# Patient Record
Sex: Female | Born: 1940 | Race: White | Hispanic: No | Smoking: Never smoker
Health system: Southern US, Community
[De-identification: ages and names within clinical notes are randomized; demographics above are authoritative.]

## PROBLEM LIST (undated history)

## (undated) DIAGNOSIS — G5760 Lesion of plantar nerve, unspecified lower limb: Secondary | ICD-10-CM

## (undated) DIAGNOSIS — E079 Disorder of thyroid, unspecified: Secondary | ICD-10-CM

## (undated) DIAGNOSIS — F32A Depression, unspecified: Secondary | ICD-10-CM

## (undated) DIAGNOSIS — I10 Essential (primary) hypertension: Secondary | ICD-10-CM

## (undated) DIAGNOSIS — E785 Hyperlipidemia, unspecified: Secondary | ICD-10-CM

## (undated) DIAGNOSIS — I639 Cerebral infarction, unspecified: Secondary | ICD-10-CM

## (undated) DIAGNOSIS — F419 Anxiety disorder, unspecified: Secondary | ICD-10-CM

## (undated) DIAGNOSIS — F329 Major depressive disorder, single episode, unspecified: Secondary | ICD-10-CM

## (undated) DIAGNOSIS — E559 Vitamin D deficiency, unspecified: Secondary | ICD-10-CM

## (undated) HISTORY — DX: Depression, unspecified: F32.A

## (undated) HISTORY — DX: Anxiety disorder, unspecified: F41.9

## (undated) HISTORY — PX: ABDOMINAL HYSTERECTOMY: SHX81

## (undated) HISTORY — DX: Lesion of plantar nerve, unspecified lower limb: G57.60

## (undated) HISTORY — DX: Major depressive disorder, single episode, unspecified: F32.9

## (undated) HISTORY — PX: BREAST BIOPSY: SHX20

## (undated) HISTORY — DX: Hyperlipidemia, unspecified: E78.5

## (undated) HISTORY — PX: ECTOPIC PREGNANCY SURGERY: SHX613

## (undated) HISTORY — DX: Disorder of thyroid, unspecified: E07.9

## (undated) HISTORY — PX: JOINT REPLACEMENT: SHX530

## (undated) HISTORY — DX: Essential (primary) hypertension: I10

## (undated) HISTORY — DX: Cerebral infarction, unspecified: I63.9

## (undated) HISTORY — DX: Vitamin D deficiency, unspecified: E55.9

---

## 1998-05-29 ENCOUNTER — Encounter: Payer: Self-pay | Admitting: Family Medicine

## 1998-05-29 ENCOUNTER — Ambulatory Visit (HOSPITAL_COMMUNITY): Admission: RE | Admit: 1998-05-29 | Discharge: 1998-05-29 | Payer: Self-pay | Admitting: Family Medicine

## 2000-03-26 ENCOUNTER — Ambulatory Visit (HOSPITAL_COMMUNITY): Admission: RE | Admit: 2000-03-26 | Discharge: 2000-03-26 | Payer: Self-pay | Admitting: Family Medicine

## 2000-03-26 ENCOUNTER — Encounter: Payer: Self-pay | Admitting: Family Medicine

## 2001-03-29 ENCOUNTER — Encounter: Payer: Self-pay | Admitting: Family Medicine

## 2001-03-29 ENCOUNTER — Ambulatory Visit (HOSPITAL_COMMUNITY): Admission: RE | Admit: 2001-03-29 | Discharge: 2001-03-29 | Payer: Self-pay | Admitting: Family Medicine

## 2001-07-29 ENCOUNTER — Encounter: Admission: RE | Admit: 2001-07-29 | Discharge: 2001-07-29 | Payer: Self-pay | Admitting: Family Medicine

## 2001-07-29 ENCOUNTER — Encounter: Payer: Self-pay | Admitting: Family Medicine

## 2001-08-12 ENCOUNTER — Encounter: Admission: RE | Admit: 2001-08-12 | Discharge: 2001-08-12 | Payer: Self-pay | Admitting: Family Medicine

## 2001-08-12 ENCOUNTER — Encounter: Payer: Self-pay | Admitting: Family Medicine

## 2002-06-15 ENCOUNTER — Encounter: Payer: Self-pay | Admitting: Family Medicine

## 2002-06-15 ENCOUNTER — Ambulatory Visit (HOSPITAL_COMMUNITY): Admission: RE | Admit: 2002-06-15 | Discharge: 2002-06-15 | Payer: Self-pay | Admitting: Family Medicine

## 2003-06-20 ENCOUNTER — Ambulatory Visit (HOSPITAL_COMMUNITY): Admission: RE | Admit: 2003-06-20 | Discharge: 2003-06-20 | Payer: Self-pay | Admitting: Family Medicine

## 2003-07-19 ENCOUNTER — Ambulatory Visit (HOSPITAL_COMMUNITY): Admission: RE | Admit: 2003-07-19 | Discharge: 2003-07-19 | Payer: Self-pay | Admitting: Gastroenterology

## 2003-07-19 ENCOUNTER — Encounter (INDEPENDENT_AMBULATORY_CARE_PROVIDER_SITE_OTHER): Payer: Self-pay | Admitting: *Deleted

## 2004-04-15 ENCOUNTER — Other Ambulatory Visit: Admission: RE | Admit: 2004-04-15 | Discharge: 2004-04-15 | Payer: Self-pay | Admitting: Family Medicine

## 2004-06-20 ENCOUNTER — Ambulatory Visit (HOSPITAL_COMMUNITY): Admission: RE | Admit: 2004-06-20 | Discharge: 2004-06-20 | Payer: Self-pay | Admitting: Family Medicine

## 2004-10-15 ENCOUNTER — Encounter: Admission: RE | Admit: 2004-10-15 | Discharge: 2004-10-15 | Payer: Self-pay | Admitting: Family Medicine

## 2005-07-25 ENCOUNTER — Other Ambulatory Visit: Admission: RE | Admit: 2005-07-25 | Discharge: 2005-07-25 | Payer: Self-pay | Admitting: Family Medicine

## 2005-08-13 ENCOUNTER — Ambulatory Visit (HOSPITAL_COMMUNITY): Admission: RE | Admit: 2005-08-13 | Discharge: 2005-08-13 | Payer: Self-pay | Admitting: Family Medicine

## 2005-12-15 ENCOUNTER — Encounter: Payer: Self-pay | Admitting: Vascular Surgery

## 2005-12-15 ENCOUNTER — Ambulatory Visit (HOSPITAL_COMMUNITY): Admission: RE | Admit: 2005-12-15 | Discharge: 2005-12-15 | Payer: Self-pay | Admitting: Family Medicine

## 2006-07-28 ENCOUNTER — Other Ambulatory Visit: Admission: RE | Admit: 2006-07-28 | Discharge: 2006-07-28 | Payer: Self-pay | Admitting: Family Medicine

## 2006-08-31 ENCOUNTER — Ambulatory Visit (HOSPITAL_COMMUNITY): Admission: RE | Admit: 2006-08-31 | Discharge: 2006-08-31 | Payer: Self-pay | Admitting: Family Medicine

## 2007-01-26 ENCOUNTER — Ambulatory Visit: Payer: Self-pay | Admitting: Internal Medicine

## 2007-02-08 ENCOUNTER — Ambulatory Visit: Payer: Self-pay | Admitting: Cardiovascular Disease

## 2007-02-09 ENCOUNTER — Ambulatory Visit: Payer: Self-pay | Admitting: Internal Medicine

## 2007-02-09 DIAGNOSIS — I10 Essential (primary) hypertension: Secondary | ICD-10-CM

## 2007-02-09 DIAGNOSIS — G4730 Sleep apnea, unspecified: Secondary | ICD-10-CM | POA: Insufficient documentation

## 2007-02-09 DIAGNOSIS — E669 Obesity, unspecified: Secondary | ICD-10-CM

## 2007-02-09 DIAGNOSIS — J383 Other diseases of vocal cords: Secondary | ICD-10-CM

## 2007-04-04 DIAGNOSIS — I639 Cerebral infarction, unspecified: Secondary | ICD-10-CM

## 2007-04-04 HISTORY — DX: Cerebral infarction, unspecified: I63.9

## 2007-04-10 ENCOUNTER — Ambulatory Visit: Payer: Self-pay | Admitting: Internal Medicine

## 2007-04-10 ENCOUNTER — Inpatient Hospital Stay (HOSPITAL_COMMUNITY): Admission: EM | Admit: 2007-04-10 | Discharge: 2007-04-13 | Payer: Self-pay | Admitting: Emergency Medicine

## 2007-04-10 ENCOUNTER — Encounter (INDEPENDENT_AMBULATORY_CARE_PROVIDER_SITE_OTHER): Payer: Self-pay | Admitting: Neurology

## 2007-04-11 ENCOUNTER — Encounter (INDEPENDENT_AMBULATORY_CARE_PROVIDER_SITE_OTHER): Payer: Self-pay | Admitting: Neurology

## 2007-04-12 ENCOUNTER — Encounter (INDEPENDENT_AMBULATORY_CARE_PROVIDER_SITE_OTHER): Payer: Self-pay | Admitting: Neurology

## 2007-04-13 ENCOUNTER — Encounter: Payer: Self-pay | Admitting: Internal Medicine

## 2007-04-24 ENCOUNTER — Ambulatory Visit: Payer: Self-pay | Admitting: Internal Medicine

## 2007-04-24 ENCOUNTER — Other Ambulatory Visit: Payer: Self-pay

## 2007-04-24 ENCOUNTER — Observation Stay: Payer: Self-pay | Admitting: Internal Medicine

## 2007-04-27 ENCOUNTER — Ambulatory Visit: Payer: Self-pay | Admitting: Cardiology

## 2007-09-01 ENCOUNTER — Ambulatory Visit (HOSPITAL_COMMUNITY): Admission: RE | Admit: 2007-09-01 | Discharge: 2007-09-01 | Payer: Self-pay | Admitting: Family Medicine

## 2007-09-06 ENCOUNTER — Encounter: Admission: RE | Admit: 2007-09-06 | Discharge: 2007-09-06 | Payer: Self-pay | Admitting: Family Medicine

## 2008-01-18 ENCOUNTER — Inpatient Hospital Stay (HOSPITAL_COMMUNITY): Admission: RE | Admit: 2008-01-18 | Discharge: 2008-01-21 | Payer: Self-pay | Admitting: Orthopedic Surgery

## 2008-06-01 ENCOUNTER — Ambulatory Visit: Payer: Self-pay | Admitting: Internal Medicine

## 2008-06-01 DIAGNOSIS — R05 Cough: Secondary | ICD-10-CM

## 2008-06-05 ENCOUNTER — Telehealth (INDEPENDENT_AMBULATORY_CARE_PROVIDER_SITE_OTHER): Payer: Self-pay | Admitting: *Deleted

## 2008-09-07 ENCOUNTER — Ambulatory Visit (HOSPITAL_COMMUNITY): Admission: RE | Admit: 2008-09-07 | Discharge: 2008-09-07 | Payer: Self-pay | Admitting: Internal Medicine

## 2009-09-18 ENCOUNTER — Encounter: Admission: RE | Admit: 2009-09-18 | Discharge: 2009-09-18 | Payer: Self-pay | Admitting: Family Medicine

## 2009-10-11 ENCOUNTER — Ambulatory Visit (HOSPITAL_COMMUNITY): Admission: RE | Admit: 2009-10-11 | Discharge: 2009-10-11 | Payer: Self-pay | Admitting: Family Medicine

## 2009-11-27 ENCOUNTER — Encounter: Admission: RE | Admit: 2009-11-27 | Discharge: 2009-11-27 | Payer: Self-pay | Admitting: Neurology

## 2010-07-16 NOTE — Assessment & Plan Note (Signed)
[facility]                            CARDIOLOGY OFFICE NOTE   RAIGAN, BARIA                 MRN:          [id number]  DATE:02/08/2007                            DOB:          Sep 07, 1940    Ms. Borrelli is a pleasant patient referred by Dr. Kriste Basque and Rubye Oaks for chest pain, dyspnea and hypertension.   The patient is extremely talkative.  She is a former Social worker.  She  actually needs preoperative clearance as well for knee surgery in  February by Dr. Charlann Boxer.  She has been having increasing shortness of  breath.  She has a fairly prolonged history of vocal cord problems and  has been treated for reflux.   She is not a previous smoker.  She was seen for pneumonia a couple of  months ago and was treated with azithromycin for two courses.  She has  PFTs in the chart which really do not show any significant emphysema.  She had a CT scan at [facility] on January 22, 2007 that  was negative for PE without any significant pneumonia or interstitial  lung disease.  In talking to the patient, she has significant arthritis  in both knees. She has gained 25 pounds in the last year and I suspect  this has a lot to do with her dyspnea.   She has had some fleeting atypical chest pain that is not always  exertional, it can be at rest. It is not progressive. There is no  radiation or associated diaphoresis and the pain does not clearly  positional, it happens maybe once a month.   She has no documented history of coronary disease.  She did go to a  health fair recently where ABIs were normal, there was no evidence of  carotid disease.  She was told she had an abnormal EKG and needed a  followup.  Her EKG is essentially normal except for a tall R wave in  lead V1 with insignificant Q waves in II, III and F.   Review of systems is otherwise negative.   PAST MEDICAL HISTORY:  1. Remarkable for osteoarthritis of the knees.  2. She  had a treadmill test which was normal in '98 in [place].  3. She has a history of reflux.  4. Hypertension.  5. Hypothyroidism.   ALLERGIES:  No known drug allergies.   FAMILY HISTORY:  Noncontributory.   MEDICATIONS:  She is currently on Levothroid 1200 mcg daily, Celebrex 1-  2 p.r.n., Micardis 40/12.5, Nexium 40 daily.   PHYSICAL EXAMINATION:  GENERAL:  Remarkable for talkative and overweight  white female in no distress.  VITAL SIGNS:  Weight is 217, respiratory rate 14, afebrile, blood  pressure is 148/87, pulse 88 and regular, respiratory rate 14, afebrile.  HEENT:  Unremarkable.  NECK:  Carotids are normal without bruit. No lymphadenopathy,  thyromegaly, JVP elevation.  LUNGS:  Clear. Good diaphragmatic motion, no wheezing.  CARDIOVASCULAR:  S1, S2 normal heart sounds, PMI normal.  ABDOMEN:  Benign, bowel sounds positive, no tenderness, no bruit, no  hepatosplenomegaly, no hepatojugular reflux.  EXTREMITIES:  Distal  pulses intact, no edema.  NEURO:  Nonfocal.  SKIN:  Warm and dry.  MUSCULOSKELETAL:  No muscular weakness.   EKG was as indicated with a tall R wave in V1 and significant Q waves in  II, III and F.  I reviewed her chest x-ray and CT scan.   IMPRESSION:  1. Chest pain, fleeting atypical but need for postoperative clearance.      Check Adenosine Myoview.  2. Exertional dyspnea not clearly related to her lungs; normal PFT, CT      with no PE.  Check 2D echocardiogram, doubt significant decrease in      LV function or valvular heart disease.  3. Hypertension.  Suggest she should go up on her Micardis to 80/12.5,      will leave this up to [employer] who she will see tomorrow. I      wrote a script for it. Continue low salt diet.  4. History of reflux with vocal cord problems. Continue Nexium 40      daily. Follow with Dr. Sherene Sires.  5. Osteoarthritis of the knees. Echocardiogram and Cardiolite will      likely be normal and she can be cleared for surgery  with Dr. Charlann Boxer.      I suspect she will proceed with left knee surgery first as it is      worst.     Theron Arista C. Eden Emms, MD, [facility]  Electronically Signed    PCN/MedQ  DD: 02/08/2007  DT: 02/08/2007  Job #: [id number]   cc:   Lonzo Cloud. Kriste Basque, MD  Madlyn Frankel Charlann Boxer, M.D.  Rubye Oaks, NP

## 2010-07-16 NOTE — Discharge Summary (Signed)
NAMEAARUSHI, HEMRIC          ACCOUNT NO.:  [id number]   MEDICAL RECORD NO.:  [id number]          PATIENT TYPE:  INP   LOCATION:  1613                         FACILITY:  [facility]   PHYSICIAN:  Madlyn Frankel. Charlann Boxer, M.D.  DATE OF BIRTH:  Nov 30, 1940   DATE OF ADMISSION:  01/18/2008  DATE OF DISCHARGE:  01/21/2008                               DISCHARGE SUMMARY   ADMITTING DIAGNOSES:  1. Osteoarthritis.  2. Hypertension.  3. Patent foramen ovale.  4. Right hemispheric ischemic stroke.  5. Hypothyroidism.   DISCHARGE DIAGNOSES:  1. Osteoarthritis.  2. Hypertension.  3. Patent foramen ovale.  4. Right hemispheric ischemic stroke.  5. Hypothyroidism.   HISTORY OF PRESENT ILLNESS:  A 70 year old female with a history of left  knee pain secondary to osteoarthritis.  Refractory to all conservative  treatment.   CONSULTS:  None.   PROCEDURE:  Left total knee replacement by surgeon Dr. Durene Romans.  Assistant Dwyane Luo, P.A.-C.   LABORATORY DATA:  Preoperative UA showed rare bacteria.  CBC, tracked  throughout her course of stay, final reading of her CBC showed her white  blood cells of 9, hemoglobin 9.8, hematocrit 28.5, platelets 167.  Metabolic, final reading, sodium 141, potassium 4.1, BUN 11, creatinine  was f0.74, glucose 119.  Calcium 8.5.  Coagulation all within normal  limits.   RADIOLOGY:  Portable chest showed no acute cardiopulmonary disease.   CARDIOLOGY:  No EKG found in chart.   HOSPITAL COURSE:  Patient underwent a left total knee replacement,  tolerated procedure well, was admitted to the orthopedic floor.  She had  limited progress during the course of stay.  She was hemodynamically  stable throughout.  Her dressing was changed.  There was no significant  drainage from the wound.  We did keep her fluids increased for the first  2 days just to continue with good kidney perfusion.  She remained  afebrile.  We did give her some Xanax for some anxiety issues while  in  the hospital.  When seen on day 3, she was ready for discharge to SNF as  a bed was available.  Wound looked good.  She was able to do a straight  leg raise and was in good spirits and motivated.   DISCHARGE DISPOSITION:  Discharge to skilled nursing facility rehab,  stable and improved condition.   DISCHARGE PHYSICAL THERAPY:  Weightbearing as tolerated with use of  rolling walker with progression to single-point cane over the next  couple weeks.  Goals of physical therapy will be to attain a range of  motion 0 to 120 degrees, increase hip flexure, quad strength.  Encourage  independence of activities of daily living.   DISCHARGE WOUND CARE:  Keep dry.   DISCHARGE MEDICATIONS:  1. Robaxin 500 mg 1 p.o. q.6 p.r.n. muscle spasm pain.  2. Iron 325 mg 1 p.o. t.i.d. x2 weeks.  3. Colace 100 mg 1 p.o. b.i.d. p.r.n. constipation while on narcotics.  4. MiraLax 17 g p.o. daily p.r.n. constipation while on narcotics.  5. Norco 7.5/325 one to two p.o. q.4 to 6 p.r.n. pain.  6. Micardis HCT 80/12.5  one p.o. q.a.m.  7. Levothyroxine 100 mcg p.o. q.a.m.  8. Omeprazole 20 mg 1 p.o. q.a.m.  9. Metoprolol 25 mg 1/2 tab b.i.d.  10.Pravastatin 20 mg 1 p.o. daily  11.Zolpidem 10 mg 1 p.o. q.h.s.  12.Plavix 75 mg p.o. daily.  13.Vitamin C 1000 mg 1 p.o. daily.  14.Vitamin D 400 units 1 p.o. daily.  15.Multivitamin p.o. daily.   DISCHARGE FOLLOWUP:  1. Follow up with Dr. Charlann Boxer at phone number [phone] in 2 weeks for      wound check.  2. Follow up with [facility] about spot on abdomen, phone      number [phone] at patient's convenience.     ______________________________  Yetta Glassman. Loreta Ave, [place]      Madlyn Frankel. Charlann Boxer, M.D.  Electronically Signed    BLM/MEDQ  D:  01/21/2008  T:  01/21/2008  Job:  [id number]   cc:   Gretta Arab. Valentina Lucks, M.D.  Fax: [phone]   Coral Ceo, M.D.   Estanislado Pandy, MD  Fax: [phone]   Pramod P. Pearlean Brownie, MD  Fax: [phone]

## 2010-07-16 NOTE — Discharge Summary (Signed)
NAME:  Emily Bartlett, Emily Bartlett          ACCOUNT NO.:  [id number]   MEDICAL RECORD NO.:  [id number]          PATIENT TYPE:  INP   LOCATION:  4731                         FACILITY:  [facility]   PHYSICIAN:  Pramod P. Pearlean Brownie, MD    DATE OF BIRTH:  [birth year]   DATE OF ADMISSION:  04/10/2007  DATE OF DISCHARGE:  04/13/2007                               DISCHARGE SUMMARY   DIAGNOSES AT TIME OF DISCHARGE:  1. 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  2. Hypertension.  3. Obesity.  4. Gastroesophageal reflux disease.  5. Hypothyroidism.  6. Hysterectomy in 1992 with bilateral salpingo-oophorectomy.  7. Gingivitis.  8. Ectopic pregnancy, 1972.   MEDICINES AT TIME OF DISCHARGE:  1. Micardis/hydrochlorothiazide 80/12.5 mg one a day.  2. Omeprazole 20 mg a day.  3. Synthroid 100 mcg a day.  4. Plavix 75 mg a day.   STUDIES PERFORMED:  1. CT of the brain on admission showed no acute abnormalities.  2. MRI of the brain showed a right MCA M2 branch infarct.  3. MRA of the neck was unremarkable.  4. MRA shows mild supraclinoid atherosclerotic changes in the left      internal carotid artery that is nonstenotic.  5. 2-D echocardiogram shows EF of 80%, no source of on bolus.  6. Carotid Doppler shows no ICA stenosis.  7. TEE performed by Dr. 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  8. EKG shows normal sinus rhythm with occasional PACs, lateral infarct      age undetermined, inferior infarct age undetermined.   LABORATORY STUDIES:  CBC is normal.  Chemistry with glucose 125,  otherwise normal.  Coagulation studies normal.  Liver function tests  normal.  Cardiac enzymes negative.  Cholesterol 161, triglycerides 143,  HDL 54 and LDL 78.  Hemoglobin A1c is 6.6.  Hypercoagulable panel showed  antithrombin-3 of 90.  Protein C functional 180.  Protein C  102.  Protein S functional 125.  Protein S 152.  Lupus anticoagulant is  negative.  Factor V gene mutation is negative.  Prothrombin-2 gene  mutation is negative.   HISTORY OF PRESENT ILLNESS:  Emily Bartlett is a 70 year old  Caucasian female who was reading and watched TV the day of admission  when around 2 o'clock in the afternoon she found herself unable to speak  distinctly and had left facial droop and left arm weakness.  She arrived  to the emergency room.  By the time she arrived here, symptoms had  largely cleared.  She has risk factors for stroke of hypertension and  obesity.  She was not a t-PA candidate secondary to quick resolution.  She was admitted to the hospital for further stroke evaluation.   HOSPITAL COURSE:  MRI did show acute stroke in the right MCA M2 branch.  The branch looked occluded as per embolus, but no embolic source was  found including after doing a transesophageal echocardiogram.  Of note,  the TEE did show a small PFO with positive right-to-left shunt with  Valsalva maneuver and a positive bubble study.  She was placed on Plavix  for secondary stroke prevention.  In the hospital she only had some mild  dysarthria and this is continuing to improve.  She has no further  therapy needs.   CONDITION ON DISCHARGE:  The patient alert and or x3.  Speech slightly  dysarthric though no aphasia.  Her visual fields are full.  Her  extraocular movements are intact.  Heart rate is regular.  Breath sounds  were clear.  Abdomen is soft.  She has good strength in all four  extremities with no drift.  Her sensation is within normal limits to  light touch.   DISCHARGE/PLAN:  1. Discharge home with family.  2. No therapy needs.  3. Plavix for secondary stroke prevention.  4. Outpatient bubble study and emboli monitoring at Dr. Marlis Edelson office      in the next 1 month, the patient to call and set up appointment.  5. Follow up with Dr. Maurice Small within 1  month.      Emily Bartlett, N.P.    ______________________________  Emily Schlein. Pearlean Brownie, MD    SB/MEDQ  D:  04/13/2007  T:  04/15/2007  Job:  [id number]   cc:   Gretta Arab. Valentina Lucks, M.D.

## 2010-07-16 NOTE — H&P (Signed)
NAMEVIRGENE, TIRONE          ACCOUNT NO.:  [id number]   MEDICAL RECORD NO.:  [id number]          PATIENT TYPE:  INP   LOCATION:  1613                         FACILITY:  [facility]   PHYSICIAN:  Madlyn Frankel. Charlann Boxer, M.D.  DATE OF BIRTH:  1940-07-22   DATE OF ADMISSION:  01/18/2008  DATE OF DISCHARGE:                              HISTORY & PHYSICAL   PROCEDURE:  Left total knee replacement.   CHIEF COMPLAINT:  Left knee pain.   HISTORY OF PRESENT ILLNESS:  A 70 year old female with a history of left  knee pain secondary to osteoarthritis.  It has been refractory to all  conservative treatment.  She does have a recent history of right  hemispheric ischemic stroke and has been on Plavix.  She was  presurgically assessed prior to surgery with discontinuation of the  Plavix 7 days before surgery with the use of aspirin perioperatively.   PAST MEDICAL HISTORY:  Significant for:  1. Osteoarthritis.  2. Hypertension.  3. Patent foramen ovale.  4. Right hemispheric ischemic stroke.  5. Hypothyroidism.   PAST SURGERIES:  None.   FAMILY HISTORY:  Cancer, glaucoma, breast cancer, melanoma.   SOCIAL HISTORY:  She is widowed.  She is a previous Runner, broadcasting/film/video and a Water quality scientist.  She does not have a caregiver lined up after  surgery, is planning on a rehab stay postoperatively.   DRUG ALLERGIES:  No known drug allergies.   PRIMARY CARE PHYSICIAN:  Dr. Maurice Small.   SPECIALTY PHYSICIANS:  Include:  1. Dr. Coral Ceo, cardiologist at [facility].  2. Dr. Deneen Harts.  3. Dr. Delia Heady.   CURRENT MEDICATIONS:  1. Levothyroxine 100 mcg p.o. daily.  2. Omeprazole unknown dosage amount daily.  3. Plavix daily.  4. Aspirin 81 mg p.o. daily x7 days before surgery.  5. Pravastatin daily.  6. Metoprolol daily.  7. Vitamin D 400 units daily.  8. Micardis 12.5 mg p.o. daily.  9. Ambien 10 mg p.o. nightly p.r.n.  10.Celebrex 100 mg 1 p.o. b.i.d. x2 weeks after  surgery.   Please verify all dosage and frequencies with the patient prior to  administration and upon admission to hospital.   REVIEW OF SYSTEMS:  GENERAL:  She has some fatigue issues.  HEENT:  She  has some hearing loss.  GENITOURINARY:  She has increased urinary  frequency and urinating at night.  MUSCULOSKELETAL:  She has joint pain,  morning stiffness.  Otherwise see HPI.   PHYSICAL EXAMINATION:  GENERAL:  Awake, alert and oriented, well-  developed, well-nourished, no acute distress.  Height 5 feet 4 inches.  Weight 200 pounds.  Pulse 72.  Respirations 16.  Blood pressure 104/76.   HEENT:  Normocephalic.  NECK:  Supple.  No carotid bruits.  CHEST:  Lungs are clear to auscultation bilaterally.  BREASTS:  Deferred.  HEART:  Regular rate and rhythm.  S1, S2 distinct.  ABDOMEN:  Soft, nontender, nondistended.  Bowel sounds present.  PELVIC:  Stable.  GENITOURINARY:  Deferred.  EXTREMITIES:  Left knee 10 to 100 degrees varus deformity.  SKIN:  No cellulitis.  NEUROLOGIC:  Intact  distal sensibilities.   LABS:  EKG, chest x-ray all pending presurgical testing.   IMPRESSION:  Left knee osteoarthritis.   PLAN OF ACTION:  A left total knee replacement [facility] by  surgeon Dr. Durene Romans on January 18, 2008.  Risks and complications  were discussed.   No postoperative medications were provided at time of history and  physical as the patient is planning on a rehab stay postoperatively.  Celebrex was provided for perioperative use and will be continued 2  weeks after surgery.     ______________________________  Yetta Glassman [address], [place]      Madlyn Frankel. Charlann Boxer, M.D.  Electronically Signed    BLM/MEDQ  D:  01/19/2008  T:  01/19/2008  Job:  [id number]   cc:   Gretta Arab. Valentina Lucks, M.D.  Fax: [phone]   Coral Ceo, DR   Estanislado Pandy, MD  Fax: [phone]   Pramod P. Pearlean Brownie, MD  Fax: [phone]

## 2010-07-16 NOTE — Assessment & Plan Note (Signed)
HEALTHCARE                             PULMONARY OFFICE NOTE   Emily Bartlett, Emily Bartlett                 MRN:          [id number]  DATE:01/26/2007                            DOB:          [birth year]    REFERRING PHYSICIAN:  Pearline Cables, MD   CHIEF COMPLAINT:  Cough and shortness of breath.   HISTORY:  This is a 70 year old white female, a never smoker, who was  evaluated for a similar problem in 2004, by Dr. Barbaraann Share, who  felt that she had vocal cord dysfunction secondary to LPR.  He had  placed her on Protonix twice daily for one month only, and she seemed  better after this time and therefore did not continue the medicine or  see [place] back,   She comes in now with paroxysms of dyspnea over the last year, typically  associated with loss of voice, worse with colds, but otherwise no  particular pattern in terms of an obvious aggravating or alleviating  factor.  It seems to be worse as the day goes on.  It is not associated  with excess mucus production or chest pain.  She actually does fine  sleeping.   PAST MEDICAL HISTORY:  Significant for obesity complicated by  hypertension and sleep apnea.   PAST SURGICAL HISTORY:  She is status post hysterectomy and a tubal  ligation.   ALLERGIES:  No known drug allergies.   CURRENT MEDICATIONS:  1. Levothroid.  2. Celebrex.  3. Although she only listed the above, she also takes Enalapril.  4. She has been placed on Hycodan cough syrup.  5. Zithromax.  6. Omnicef.  All of these with no apparent improvement.   SOCIAL HISTORY:  She has never smoked.  She denies any unusual travel,  pet or hobby exposure.   FAMILY HISTORY:  Positive for lung cancer in her father and mother.   REVIEW OF SYSTEMS:  Taken in detail on the work sheet and negative other  than as above.  The patient has had a pattern for years and years of  intermittent migratory chest discomfort sometimes in the left flank  and  sometimes in the right, only lasting for a few seconds.  Never pleuritic  or related to exertion.  Never occurring lying down (typical of  irritable bowel syndrome), and not associated with dyspnea or nausea.   PHYSICAL EXAMINATION:  GENERAL:  She is an animated white female who has  a hard time sticking to one question at a time before jumping to  another.  She was an exceptionally difficult historian who prefers to  dwell on diagnoses rather than answer questions related to symptoms.  VITAL SIGNS:  She is afebrile.  Blood pressure 150/98 today.  HEENT:  Unremarkable.  There is no nasal turbinate edema and no erythema  of the oropharynx.  Ear canals clear bilaterally.  NECK:  Supple without cervical adenopathy or tenderness.  Trachea is  midline.  No thyromegaly.  LUNGS:  Fields are perfectly bilaterally to percussion, although there  was minimal pseudo-wheeze.  HEART:  A regular rhythm without murmur, gallop or  rub.  ABDOMEN:  Soft, benign.  EXTREMITIES:  Warm without calf tenderness, cyanosis, clubbing or edema.   Although she has carried a diagnosis of pneumonia, I do not have an x-  ray.  The most recent studies that she is a CT scan dated January 22, 2007, and is normal.   IMPRESSION/PLAN:  1. 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  Interestingly, she had a very convincing response to      proton pump inhibitor therapy four years ago and did not put two      and two together, probably because it takes more than just the      reflux to bring out the symptoms.  For instance, she told me a      cold would make her worse.  Now it may well be that the ACE      inhibitor is also making her worse, albeit through different      mechanisms.  In any case, she does not appear to have a significant      lung problem and I believe the problem can be resolved by  treating      her aggressively for reflux, as we did in the past as follows:  1.      Continue to take Nexium 40 mg, one tab 30-60 minutes before      breakfast every day.  2.  Change Enalapril to Micardis 40/12.5 mg      one daily. 3.  Return in two weeks, to check response and obtain      prescriptions for the above medications, if improved, and if she is      improved I would treat her for three whole months before      considering changing the therapy.  If not improved, the next step      would be a sinus CT scan and increase in the medications for      reflux, but could include twice daily proton pump inhibitor,      ranitidine q.h.s. and/or Reglan.  2. She does have a history of fleeting anterior chest pains that are      migratory in nature.  It is strongly suggested to me an irritable      bowel syndrome.  I have recommended Citrucel 1 tsp twice daily.  3. I noted that her blood pressure is not well-controlled on      Enalapril, and may need to adjust the Micardis upward on her next      visit.     Emily Bartlett. Emily Sires, MD, [facility]  Electronically Signed    MBW/MedQ  DD: 01/27/2007  DT: 01/27/2007  Job #: [id number]

## 2010-07-16 NOTE — Op Note (Signed)
Emily Bartlett, Emily Bartlett          ACCOUNT NO.:  [id number]   MEDICAL RECORD NO.:  [id number]          PATIENT TYPE:  INP   LOCATION:  1613                         FACILITY:  [facility]   PHYSICIAN:  Madlyn Frankel. Charlann Boxer, M.D.  DATE OF BIRTH:  1940/03/25   DATE OF PROCEDURE:  01/18/2008  DATE OF DISCHARGE:                               OPERATIVE REPORT   PREOPERATIVE DIAGNOSIS:  Left knee osteoarthritis.   POSTOPERATIVE DIAGNOSIS:  Left knee osteoarthritis.   PROCEDURE:  Left total knee replacement.   COMPONENTS USED:  DePuy rotating platform posterior stabilized knee  system with a size 3 femur, 3 tibia, 12.5 insert and a 38 patellar  button.   SURGEON:  Madlyn Frankel. Charlann Boxer, M.D.   ASSISTANT:  Yetta Glassman. Mann, PA-C   ANESTHESIA:  Duramorph spinal.   COMPLICATIONS:  None.   DRAINS:  Times one Hemovac.   TOURNIQUET TIME:  35 minutes at 250 mmHg.   SPECIMENS:  None.   CONDITION:  The patient was taken to recovery room stable.   INDICATION FOR PROCEDURE:  Emily Bartlett is a 70 year old female who I  have been following for a couple of years for bilateral knee  osteoarthritis failing conservative measures.  She had attempted to push  things off as long as possible, failing at this point cortisone  injection, viscosupplemental medications.  Given the functionally  decreased quality of life she wished to proceed with knee replacement  surgery.  The risk of infection, DVT, component failure, need for  revision for any reason were all discussed and reviewed.  Consent was  obtained for the benefit of pain relief.   PROCEDURE IN DETAIL:  The patient was brought to the operative theater.  Once adequate anesthesia, preoperative antibiotics, Ancef administered,  the patient was positioned supine with a thigh tourniquet placed on the  left.  The left lower extremity was then pre scrubbed and prepped and  draped in sterile fashion.   A time-out was then carried out identifying the patient and  her  extremity.  The leg was exsanguinated, tourniquet elevated.  Midline  incision was made followed by median arthrotomy.  Following initial  debridement attention was directed to the patella.  Precut measure was  24 mm.  I resected down to 14 mm and used a 38 patellar button restoring  the height to that of 23-24 mm.  A metal shim was placed on the patella  to prevent the patella from injury to the saw retractors.   The patient was noted to have a preoperative flexion contracture.  Each  intramedullary canal was opened and the femur irrigated to prevent fat  emboli.  At 3 degrees of valgus I resected 11 mm of bone off the distal  femur.  The tibia was then subluxated, entered anteriorly and meniscal  stumps and cruciate stumps debrided.  Using extramedullary guide I  resected 10 mm of bone off the proximal lateral tibia.  Following this  resection I checked for extension block and was happy the knee was  coming to extension with a 10 mm insert.  The pins were removed from the  extramedullary guide.  At this point I sized the femur to be a size 3.  We checked the cut  surface of the tibia to make sure it was perpendicular in both planes.  Based on this cut the rotation was set for the femur.  The  intramedullary rod was placed and the rotation block placed.  Based on  the proximal tibia cut the rotation block was pinned then exchanged for  the size 3, 4 in 1 cutting block.  The anterior and posterior chamfer  cuts were then made without difficulty nor notching.  Final box cut was  made based off the lateral aspect of the distal femur.   At this point the tibial subluxating tear and further debridement was  carried out.  This included medial osteophytes and releasing the MCL.  I  then used a size 3 tibial tray, pinned it in position to the medial  third of the tubercle with excellent bony coverage on the cortical rim  of the tibia.  I then drilled and keel punched the tibia and did a  trial  reduction.  At this point the knee came to full extension with a 10  insert and the 38 patella tracked through the trochlea without  application of pressure.   At this point the trial components were removed.  Final components were  opened on the back table holding the polyethylene insert.  The knee was  irrigated with normal saline solution pulse lavaged.  The synovial  capsule junction was injected with 60 mL of 0.25% Marcaine with  epinephrine and 1 mL of Toradol.   Once the knee was dried the components were cemented into position.  I  brought the knee to extension with a 10 mm insert.  Extruded cement was  removed.  Once the cement had cured I did retrial with a 12.5 insert and  felt that the knee felt a bit more stable all the way from extension and  flexion.  With this I removed remaining cement and debris from the  posterior aspect of the knee, irrigated the knee and then placed a 12.5  insert to match the 3 femur.  Pulse lavage irrigation was carried out at  this point.  The medium Hemovac drain was placed in the deep capsular  tissue.   The extensor mechanism was then reapproximated using #1 Vicryl with the  knee in flexion.  The remainder of the wound was closed in layers with 2-  0 Vicryl and running 4-0 Monocryl.  The knee was cleaned, dried and  dressed sterilely with a sterile bulky wrap.  She was brought to the  recovery room in stable condition tolerating the procedure well.      Madlyn Frankel Charlann Boxer, M.D.  Electronically Signed     MDO/MEDQ  D:  01/18/2008  T:  01/19/2008  Job:  [id number]

## 2010-07-16 NOTE — Discharge Summary (Signed)
NAMESEARA, [name]          ACCOUNT NO.:  [id number]   MEDICAL RECORD NO.:  [id number]          PATIENT TYPE:  INP   LOCATION:  4731                         FACILITY:  [facility]   PHYSICIAN:  Estanislado Pandy, MD   DATE OF BIRTH:  [birth year]   DATE OF ADMISSION:  04/10/2007  DATE OF DISCHARGE:                               DISCHARGE SUMMARY   DIAGNOSIS AT TIME OF DISCHARGE:  1. 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  2. Hypertension.  3. Obesity.  4. New diagnosis of PFO.  5. Gravida 3, para 3.  6. Ectopic pregnancy 1972.  7. Hysterectomy with bilateral salpingo-oophorectomy.  8. Gingivitis.      1. Hypothyroidism.   MEDICINES AT TIME OF DISCHARGE:  Synthroid 0.1 mg a day, Nexium 40 mg a  day, Micardis/hydrochlorothiazide 80/12.5 mg a day and Plavix 75 mg a  day.   STUDIES PERFORMED:  1. A CT of the brain on admission shows no acute abnormality.  2. An MRI of the brain shows an acute right hemisphere infarct due to      occlusion of distal MCA M2 branch deep within the sylvian fissure.      Mild atrophy and small vessel disease.  No abnormal intracranial      enhancement.  3. An MRA of the neck is unremarkable.  4. An MRA of the head shows mild supraclinoid atherosclerotic changes      left internal carotid artery non stenotic.  5. She has carotid Doppler shows no ICA stenosis.  6. A 2-D echocardiogram shows EF of 80% with no source of embolus.  7. An EKG shows normal sinus rhythm with occasional PAC's, lateral      infarct, age undetermined, inferior infarct, age undetermined.   LABORATORY STUDIES:  Complete blood count normal. Chemistry with glucose  125, otherwise normal. Coagulation studies normal. Liver function tests  normal.  Cardiac enzymes negative.  Cholesterol 161, triglycerides 143,  HDL 54, LDL 78. Hemoglobin A1c was 6.6. Homocystine is pending at time  of discharge.  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  Protein C functional high 180. Protein C 102. Protein S functional 125.  Protein S high 152.  Lupus anticoagulant not detected.  Homocystine is  9.  Factor five mutation is negative. Negative prothrombin II gene  mutation.   HISTORY OF PRESENT ILLNESS:  [name] is a 70 year old  Caucasian female who was reading and watching TV the day of admission  around 2:00 p.m. She found herself unable to speak distinctly and had  left facial droop and left arm weakness.  She called EMS and by the time  she arrived to the hospital, her symptoms have largely cleared.  She has  vascular risk factors of hypertension and obesity.  She also has  gastroesophageal reflux disease and hypothyroidism.  She was not a TPA  candidate secondary to rapid improvement. She was admitted to the  hospital for further stroke evaluation.   ADDITIONAL DISCHARGE DIAGNOSIS:  Gastroesophageal reflux disease.   HOSPITAL COURSE:  Her MRI was positive for acute infarct in  the right  MCA M2 branch.  It looked occluded from an embolic source, though full  workup was completed including TEE.  No embolic source was found, though  the patient was found to have a small PFO with a positive bubble study.  She was placed on Plavix in hospital for secondary stroke prevention.  Her only neurologic residual is that of slurred speech which is  continuing to improve.  She has no outpatient therapy needs and was felt  safe for discharge home.   ADDITIONAL STUDY PERFORMED:  Transesophageal echocardiogram performed by  Dr. Gala Romney with [facility] cardiology shows a positive bubble study with  small PFO with right-to-left shunting with Valsalva maneuver, mild to  moderate plaque in the descending aorta.   DISCHARGE/PLAN:  1. Discharge home with family.  2. Plavix for secondary stroke prevention.   1. No therapy needs.  2. Outpatient bubble study and emboli monitoring in 1 month.  The      patient to call and set up  appointment.  3. Follow up with Dr. Maurice Small within 1 month.      Annie Main, N.P.    ______________________________  Estanislado Pandy, MD    SB/MEDQ  D:  04/13/2007  T:  04/14/2007  Job:  [id number]   cc:   Gretta Arab. Valentina Lucks, M.D.  Pramod P. Pearlean Brownie, MD

## 2010-07-16 NOTE — H&P (Signed)
NAME:  Emily Bartlett, Emily Bartlett          ACCOUNT NO.:  [id number]   MEDICAL RECORD NO.:  [id number]          PATIENT TYPE:  EMS   LOCATION:  MAJO                         FACILITY:  [facility]   PHYSICIAN:  Deanna Artis. Hickling, M.D.DATE OF BIRTH:  04/11/1940   DATE OF ADMISSION:  04/10/2007  DATE OF DISCHARGE:                              HISTORY & PHYSICAL   CHIEF COMPLAINT:  Left facial weakness and arm weakness.   HISTORY OF PRESENT ILLNESS:  1. The patient was reading and watching TV today.  Around 2 o'clock      she was unable to speak distinctly and had left facial droop and      left arm weakness.  This largely cleared by the time she arrived at      the hospital.  2. Risk factors for stroke include hypertension and obesity.  Other      medical problems include gastroesophageal reflux disease and      hypothyroidism.   PAST MEDICAL HISTORY:  Otherwise, unremarkable.  Gravida 3, para 3-0-1-3  woman.   PAST SURGICAL HISTORY:  Ectopic pregnancy 1972, hysterectomy 1992 with  bilateral salpingo-oophorectomy, and removal of toenail earlier this  week.  The patient also has gingivitis.   CURRENT MEDICATIONS:  1. Micardis/hydrochlorothiazide 80/12.5 one daily.  2. Omeprazole 20 mg daily.  3. Levothyroxine 100 mcg daily.   FAMILY HISTORY:  Father died of lung cancer.  Mother is alive and had  lung cancer with lobectomy and has macular degeneration.  Her son died  of melanoma.  She becomes very upset at the mention of this.  Her  husband had a series of strokes, and so she was very familiar with  stroke and recognized her predicament when it occurred today.   SOCIAL HISTORY:  The patient lives alone.  She has two daughters.  She  does not use tobacco, alcohol or drugs.  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  She has worked in tour groups, and I am not sure  what other work she has.   REVIEW OF SYSTEMS:  Dysarthria.  CARDIOVASCULAR:  No chest pain or  dyspnea.  She has a  mildly abnormal EKG, but has had that in the past.  PULMONARY:  No shortness of breath.  GI:  She has gastroesophageal  reflux disease.  GU:  No complaints.  MUSCULOSKELETAL:  Osteoarthritis  of her knees.  ENDOCRINE:  No diabetes.  REPRODUCTIVE:  Hysterectomy.  HEMATOLOGIC:  No anemia.   ALLERGIES:  Psychiatric posttraumatic stress disorder and anxiety  attacks when her son is mentioned.   PHYSICAL EXAMINATION:  VITAL SIGNS:  Temperature 97.1, blood pressure  178/80, resting pulse 88, respirations 18, temperature 99%.  HEENT:  Ears, nose, and throat:  No infections.  NECK:  Supple.  No bruits.  LUNGS:  Clear.  HEART:  No murmurs.  Pulses normal.  ABDOMEN:  Soft.  Bowel sounds normal.  No hepatosplenomegaly.  Abdomen  protuberant.  NEUROLOGIC:  Dysarthria without dysphasia.  Cranial nerves:  Round  reactive pupils.  Visual fields full, extraocular movements full.  Fundi  normal.  She has a mild  left central seventh at rest, midline tongue and  uvula.  Speech is dysarthric.  Hearing appears to be normal.  Motor  examination:  Normal strength, tone, and mass.  Good fine motor  movements.  No pronator drift.  Sensation is intact.  She says that  there may be slight difference in tactile sensation left right, but this  is not constant.  She does not extinguish to double simultaneous  stimuli.  Cerebellar examination:  Good finger-to-nose, heel-knee-shin,  gait not tested.  Deep tendon reflexes absent.  The patient had  bilateral flexor plantar responses.   IMPRESSION:  1. Completed stroke likely right brain subcortical lacunar infarction.  2. Abnormal EKG with normal cardiac enzymes.  CT scan of the brain is      normal.   LABORATORY:  Sodium 137, potassium 3.4, chloride 102, CO2 27, BUN 14,  creatinine 0.77.  GFR greater than 60.  Glucose 120.  White count 8600,  hemoglobin 13.5, hematocrit 38.6, platelet count 248,000.  Total  bilirubin 0.7, alkaline phosphatase 35.  AST 37, ALT  38.  Total protein  7.2.  Albumin 3.9.  Calcium 9.2.  CK 128, CK-MB 2, troponin less than  0.01.   PLAN:  The patient will be admitted to the hospital and have a workup  including MRI scan of the brain, MRA intracranial and extracranial  today.  We will also carry out noninvasive vascular workup which may  happen as an outpatient next week, and she will have laboratories  looking for treatable causes of stroke.  She is doing well.  We may send  her home tomorrow.  She passed her swallowing screen.  She is not a  smoker.  We have talked about weight reduction and blood pressure  control.  She understands that those are important for her health.      Deanna Artis. Sharene Skeans, M.D.  Electronically Signed     [facility]/MEDQ  D:  04/10/2007  T:  04/12/2007  Job:  [id number]   cc:   Gretta Arab. Valentina Lucks, M.D.

## 2010-07-19 NOTE — Op Note (Signed)
NAME:  Emily Bartlett, Emily Bartlett                    ACCOUNT NO.:  [id number]   MEDICAL RECORD NO.:  [id number]                   PATIENT TYPE:  AMB   LOCATION:  ENDO                                 FACILITY:  [facility]   PHYSICIAN:  John C. Madilyn Fireman, M.D.                 DATE OF BIRTH:  05-30-1940   DATE OF PROCEDURE:  07/19/2003  DATE OF DISCHARGE:                                 OPERATIVE REPORT   PROCEDURE:  Colonoscopy.   INDICATION FOR PROCEDURE:  Occasional rectal bleeding in Bartlett 70 year old  patient with no prior colon screening.   PROCEDURE:  The patient was placed in the left lateral decubitus position  and placed on the pulse monitor with continuous low flow oxygen delivered by  nasal cannula.  She was sedated with 75 mcg IV fentanyl and 7 mg IV Versed.  The Olympus video colonoscope was inserted into the rectum and advanced to  the cecum, confirmed by transillumination of McBurney's point and  visualization of the ileocecal valve and epiphyseal orifice.  The prep was  excellent.  The cecum and ascending colon appeared normal with no masses,  polyps, diverticula or other mucosal abnormalities.  In the transverse colon  there was seen Bartlett 6 mm sessile polyp that was fulgurated by hot biopsy.  The  remainder of the transverse, descending, sigmoid and rectum appeared normal  with no further polyps, masses, diverticula or other mucosal abnormalities.  The scope was then withdrawn and the patient returned to the recovery room  in stable condition.  She tolerated the procedure well and there were no  immediate complications.   IMPRESSION:  Transverse colon polyp, otherwise normal study.   PLAN:  Will await histology to determine method and interval for future  colon screening.                                               John C. Madilyn Fireman, M.D.    [facility]/MEDQ  D:  07/19/2003  T:  07/19/2003  Job:  [id number]   cc:   Gretta Arab. Valentina Lucks, M.D.  [address] [place]  [place] [address]  Fax: [phone]

## 2010-07-23 ENCOUNTER — Other Ambulatory Visit: Payer: Self-pay | Admitting: Gastroenterology

## 2010-11-22 LAB — COMPREHENSIVE METABOLIC PANEL
AST: 37
Albumin: 3.9
Alkaline Phosphatase: 51
Alkaline Phosphatase: 55
BUN: 13
CO2: 27
CO2: 28
Chloride: 103
Creatinine, Ser: 0.77
Creatinine, Ser: 0.9
GFR calc Af Amer: >60
GFR calc non Af Amer: >60
Potassium: 3.4 — ABNORMAL LOW
Total Bilirubin: 0.7
Total Bilirubin: 0.8
Total Protein: 7.2

## 2010-11-22 LAB — LIPID PANEL
LDL Cholesterol: 78
Total CHOL/HDL Ratio: 3
VLDL: 29

## 2010-11-22 LAB — DIFFERENTIAL
Basophils Absolute: 0
Basophils Absolute: 0
Basophils Relative: 1
Eosinophils Relative: 3
Lymphocytes Relative: 32
Lymphocytes Relative: 33
Monocytes Absolute: 0.7
Neutro Abs: 4.9
Neutro Abs: 5

## 2010-11-22 LAB — PROTIME-INR
INR: 1
Prothrombin Time: 13.4
Prothrombin Time: 13.5

## 2010-11-22 LAB — LUPUS ANTICOAGULANT PANEL: Lupus Anticoagulant: NOT DETECTED

## 2010-11-22 LAB — CBC
HCT: 37.1
HCT: 38.6
Hemoglobin: 12.8
MCV: 88.3
MCV: 88.4
RBC: 4.2
RDW: 13.6
WBC: 8.6
WBC: 9.1

## 2010-11-22 LAB — PROTEIN C ACTIVITY: Protein C Activity: 180 % — ABNORMAL HIGH (ref 75–133)

## 2010-11-22 LAB — CARDIOLIPIN ANTIBODIES, IGG, IGM, IGA: Anticardiolipin IgG: <7 — ABNORMAL LOW (ref <11)

## 2010-11-22 LAB — CK TOTAL AND CKMB (NOT AT ARMC)
Relative Index: 1.6
Total CK: 128

## 2010-11-22 LAB — FACTOR 5 LEIDEN

## 2010-11-22 LAB — BETA-2-GLYCOPROTEIN I ABS, IGG/M/A
Beta-2 Glyco I IgG: <4 U/mL (ref <20)
Beta-2-Glycoprotein I IgA: <4 U/mL (ref <10)

## 2010-11-22 LAB — PROTEIN S, TOTAL: Protein S Ag, Total: 152 % — ABNORMAL HIGH (ref 70–140)

## 2010-11-22 LAB — PROTEIN S ACTIVITY: Protein S Activity: 125 % (ref 69–129)

## 2010-11-22 LAB — HOMOCYSTEINE: Homocysteine: 9

## 2010-11-22 LAB — APTT: aPTT: 31

## 2010-11-22 LAB — PROTEIN C, TOTAL: Protein C, Total: 102 % (ref 70–140)

## 2010-11-22 LAB — TROPONIN I: Troponin I: 0.01

## 2010-12-03 LAB — BASIC METABOLIC PANEL
BUN: 11
CO2: 27
CO2: 28
Calcium: 8.2 — ABNORMAL LOW
Calcium: 8.5
Chloride: 100
Chloride: 106
Creatinine, Ser: 0.74
Creatinine, Ser: 0.79
GFR calc Af Amer: >60
GFR calc Af Amer: >60
GFR calc non Af Amer: >60
GFR calc non Af Amer: >60
Glucose, Bld: 119 — ABNORMAL HIGH
Glucose, Bld: 96
Potassium: 3.9
Sodium: 135
Sodium: 138
Sodium: 141

## 2010-12-03 LAB — URINALYSIS, ROUTINE W REFLEX MICROSCOPIC
Nitrite: NEGATIVE
Specific Gravity, Urine: 1.009
Urobilinogen, UA: 0.2
pH: 6

## 2010-12-03 LAB — CBC
Hemoglobin: 9.7 — ABNORMAL LOW
MCHC: 33.7
MCHC: 34.4
MCHC: 35.1
MCV: 89.1
Platelets: 167
RBC: 3.09 — ABNORMAL LOW
RDW: 13.1
RDW: 13.2
RDW: 13.3
WBC: 7.3

## 2010-12-03 LAB — DIFFERENTIAL
Basophils Absolute: 0
Basophils Relative: 0
Monocytes Absolute: 0.5
Neutro Abs: 4.5
Neutrophils Relative %: 61

## 2010-12-03 LAB — URINE MICROSCOPIC-ADD ON: RBC / HPF: NONE SEEN

## 2010-12-03 LAB — TYPE AND SCREEN: Antibody Screen: NEGATIVE

## 2010-12-09 ENCOUNTER — Other Ambulatory Visit (HOSPITAL_COMMUNITY): Payer: Self-pay | Admitting: Family Medicine

## 2010-12-09 DIAGNOSIS — Z1231 Encounter for screening mammogram for malignant neoplasm of breast: Secondary | ICD-10-CM

## 2010-12-11 ENCOUNTER — Ambulatory Visit (HOSPITAL_COMMUNITY)
Admission: RE | Admit: 2010-12-11 | Discharge: 2010-12-11 | Disposition: A | Discharge status: Home / Self Care | Payer: Medicare Other | Source: Ambulatory Visit | Attending: Family Medicine | Admitting: Family Medicine

## 2010-12-11 DIAGNOSIS — Z1231 Encounter for screening mammogram for malignant neoplasm of breast: Secondary | ICD-10-CM | POA: Insufficient documentation

## 2011-04-11 DIAGNOSIS — H40009 Preglaucoma, unspecified, unspecified eye: Secondary | ICD-10-CM | POA: Diagnosis not present

## 2011-04-23 DIAGNOSIS — E559 Vitamin D deficiency, unspecified: Secondary | ICD-10-CM | POA: Diagnosis not present

## 2011-05-08 DIAGNOSIS — M7150 Other bursitis, not elsewhere classified, unspecified site: Secondary | ICD-10-CM | POA: Diagnosis not present

## 2011-05-08 DIAGNOSIS — M722 Plantar fascial fibromatosis: Secondary | ICD-10-CM | POA: Diagnosis not present

## 2011-05-08 DIAGNOSIS — M65979 Unspecified synovitis and tenosynovitis, unspecified ankle and foot: Secondary | ICD-10-CM | POA: Diagnosis not present

## 2011-05-08 DIAGNOSIS — M659 Synovitis and tenosynovitis, unspecified: Secondary | ICD-10-CM | POA: Diagnosis not present

## 2011-05-15 DIAGNOSIS — M65979 Unspecified synovitis and tenosynovitis, unspecified ankle and foot: Secondary | ICD-10-CM | POA: Diagnosis not present

## 2011-05-15 DIAGNOSIS — M722 Plantar fascial fibromatosis: Secondary | ICD-10-CM | POA: Diagnosis not present

## 2011-05-15 DIAGNOSIS — M659 Synovitis and tenosynovitis, unspecified: Secondary | ICD-10-CM | POA: Diagnosis not present

## 2011-05-15 DIAGNOSIS — M7150 Other bursitis, not elsewhere classified, unspecified site: Secondary | ICD-10-CM | POA: Diagnosis not present

## 2011-07-22 DIAGNOSIS — E559 Vitamin D deficiency, unspecified: Secondary | ICD-10-CM | POA: Diagnosis not present

## 2011-08-20 DIAGNOSIS — I6990 Unspecified sequelae of unspecified cerebrovascular disease: Secondary | ICD-10-CM | POA: Diagnosis not present

## 2011-08-20 DIAGNOSIS — I10 Essential (primary) hypertension: Secondary | ICD-10-CM | POA: Diagnosis not present

## 2011-08-20 DIAGNOSIS — E785 Hyperlipidemia, unspecified: Secondary | ICD-10-CM | POA: Diagnosis not present

## 2011-08-20 DIAGNOSIS — M159 Polyosteoarthritis, unspecified: Secondary | ICD-10-CM | POA: Diagnosis not present

## 2011-08-20 DIAGNOSIS — F411 Generalized anxiety disorder: Secondary | ICD-10-CM | POA: Diagnosis not present

## 2011-08-20 DIAGNOSIS — Z0000 Encounter for general adult medical examination without abnormal findings: Secondary | ICD-10-CM | POA: Diagnosis not present

## 2011-08-20 DIAGNOSIS — E038 Other specified hypothyroidism: Secondary | ICD-10-CM | POA: Diagnosis not present

## 2011-08-20 DIAGNOSIS — D126 Benign neoplasm of colon, unspecified: Secondary | ICD-10-CM | POA: Diagnosis not present

## 2011-09-22 DIAGNOSIS — Q211 Atrial septal defect: Secondary | ICD-10-CM | POA: Diagnosis not present

## 2011-09-22 DIAGNOSIS — I6990 Unspecified sequelae of unspecified cerebrovascular disease: Secondary | ICD-10-CM | POA: Diagnosis not present

## 2011-09-22 DIAGNOSIS — I4510 Unspecified right bundle-branch block: Secondary | ICD-10-CM | POA: Diagnosis not present

## 2011-09-22 DIAGNOSIS — I10 Essential (primary) hypertension: Secondary | ICD-10-CM | POA: Diagnosis not present

## 2011-09-22 DIAGNOSIS — Z0389 Encounter for observation for other suspected diseases and conditions ruled out: Secondary | ICD-10-CM | POA: Diagnosis not present

## 2011-09-22 DIAGNOSIS — E785 Hyperlipidemia, unspecified: Secondary | ICD-10-CM | POA: Diagnosis not present

## 2011-09-23 DIAGNOSIS — I4510 Unspecified right bundle-branch block: Secondary | ICD-10-CM | POA: Diagnosis not present

## 2011-09-23 DIAGNOSIS — I10 Essential (primary) hypertension: Secondary | ICD-10-CM | POA: Diagnosis not present

## 2011-09-23 DIAGNOSIS — Z0389 Encounter for observation for other suspected diseases and conditions ruled out: Secondary | ICD-10-CM | POA: Diagnosis not present

## 2011-09-23 DIAGNOSIS — Q211 Atrial septal defect: Secondary | ICD-10-CM | POA: Diagnosis not present

## 2011-09-23 DIAGNOSIS — I6990 Unspecified sequelae of unspecified cerebrovascular disease: Secondary | ICD-10-CM | POA: Diagnosis not present

## 2011-09-23 DIAGNOSIS — E785 Hyperlipidemia, unspecified: Secondary | ICD-10-CM | POA: Diagnosis not present

## 2011-11-05 DIAGNOSIS — K649 Unspecified hemorrhoids: Secondary | ICD-10-CM | POA: Diagnosis not present

## 2011-11-05 DIAGNOSIS — Z23 Encounter for immunization: Secondary | ICD-10-CM | POA: Diagnosis not present

## 2011-11-07 ENCOUNTER — Encounter (INDEPENDENT_AMBULATORY_CARE_PROVIDER_SITE_OTHER): Payer: Self-pay | Admitting: Surgery

## 2011-11-12 ENCOUNTER — Other Ambulatory Visit (HOSPITAL_COMMUNITY): Payer: Self-pay | Admitting: Family Medicine

## 2011-11-12 DIAGNOSIS — Z1231 Encounter for screening mammogram for malignant neoplasm of breast: Secondary | ICD-10-CM

## 2011-11-19 ENCOUNTER — Ambulatory Visit (INDEPENDENT_AMBULATORY_CARE_PROVIDER_SITE_OTHER): Payer: Medicare Other | Admitting: Surgery

## 2011-11-19 ENCOUNTER — Encounter (INDEPENDENT_AMBULATORY_CARE_PROVIDER_SITE_OTHER): Payer: Self-pay | Admitting: Surgery

## 2011-11-19 VITALS — BP 136/80 | HR 82 | Temp 97.3°F | Resp 18 | Ht 64.0 in | Wt 177.0 lb

## 2011-11-19 DIAGNOSIS — K644 Residual hemorrhoidal skin tags: Secondary | ICD-10-CM

## 2011-11-19 NOTE — Progress Notes (Signed)
Patient ID: Emily Bartlett, female   DOB: 1941/02/05, 71 y.o.   MRN: [id number]  Chief Complaint  Patient presents with  . New Evaluation    hem    HPI Emily Bartlett is a 71 y.o. female.  Referred by Dr. Maurice Small for evaluation of hemorrhoids problems. HPI This is a 71 year old female with a long-standing history of intermittent problems with hemorrhoids. The patient has irritable bowel syndrome and alternates between constipation and diarrhea. When she is constipated her external hemorrhoids and large and become quite tender. Occasionally she sees some blood with bowel movements. She did have a recent colonoscopy which only revealed one small polyp. The patient has been taking a lot of fiber supplements to help regulate her bowel movements. This has resulted in some significant improvement. Currently the hemorrhoids are asymptomatic. These hemorrhoids began when she was pregnant with twins. She has never had surgery for hemorrhoid disease. She has been using preparation H for the enlarged hemorrhoids.  Past Medical History  Diagnosis Date  . Hypertension   . Hyperlipidemia   . Thyroid disease   . Osteoporosis   . Morton neuroma   . Depression   . Vitamin D deficiency disease   . CVA (cerebral infarction) 04/2007  . Anxiety     No past surgical history on file.  No family history on file.  Social History History  Substance Use Topics  . Smoking status: Never Smoker   . Smokeless tobacco: Not on file  . Alcohol Use: Not on file    No Known Allergies  Current Outpatient Prescriptions  Medication Sig Dispense Refill  . beta carotene w/minerals (OCUVITE) tablet Take 1 tablet by mouth daily.      . bifidobacterium infantis (ALIGN) capsule Take 1 capsule by mouth daily.      . Calcium Carbonate-Vit D-Min 600-800 MG-UNIT TABS Take by mouth.      . clopidogrel (PLAVIX) 75 MG tablet Take 75 mg by mouth daily.      Marland Kitchen levothyroxine (SYNTHROID, LEVOTHROID) 100 MCG  tablet Take 100 mcg by mouth daily.      Marland Kitchen losartan-hydrochlorothiazide (HYZAAR) 50-12.5 MG per tablet Take 1 tablet by mouth daily.      Marland Kitchen omeprazole (PRILOSEC) 20 MG capsule Take 20 mg by mouth daily.      . pravastatin (PRAVACHOL) 20 MG tablet Take 20 mg by mouth daily.      . sertraline (ZOLOFT) 25 MG tablet Take 25 mg by mouth daily.      . traMADol (ULTRAM) 50 MG tablet Take 50 mg by mouth every 6 (six) hours as needed.      . zolpidem (AMBIEN) 10 MG tablet Take 10 mg by mouth at bedtime as needed.        Review of Systems Review of Systems  Constitutional: Negative for fever, chills and unexpected weight change.  HENT: Positive for hearing loss. Negative for congestion, sore throat, trouble swallowing and voice change.   Eyes: Negative for visual disturbance.  Respiratory: Negative for cough and wheezing.   Cardiovascular: Positive for leg swelling. Negative for chest pain and palpitations.  Gastrointestinal: Positive for diarrhea, constipation, blood in stool and rectal pain. Negative for nausea, vomiting, abdominal pain, abdominal distention and anal bleeding.  Genitourinary: Negative for hematuria, vaginal bleeding and difficulty urinating.  Musculoskeletal: Positive for arthralgias.  Skin: Negative for rash and wound.  Neurological: Negative for seizures, syncope and headaches.  Hematological: Negative for adenopathy. Bruises/bleeds easily.  Psychiatric/Behavioral: Negative for confusion.  Blood pressure 136/80, pulse 82, temperature 97.3 F (36.3 C), temperature source Temporal, resp. rate 18, height 5\' 4"  (1.626 m), weight 177 lb (80.287 kg), SpO2 96.00%.  Physical Exam Physical Exam WDWN in NAD Rectal - no signs of abscess or fistula No obvious anal fissure Small soft, non-inflamed or thrombosed external hemorrhoids Excellent sphincter tone No signficant internal hemorrrhoid disease Data Reviewed none  Assessment    Small external hemorrhoids - exacerbated by  constipation    Plan    Fiber supplements daily PRN stool softeners No surgical indications Follow-up PRN.       Jaquell Seddon K. 11/19/2011, 4:59 PM

## 2011-11-25 ENCOUNTER — Encounter (INDEPENDENT_AMBULATORY_CARE_PROVIDER_SITE_OTHER): Payer: Self-pay

## 2011-11-26 DIAGNOSIS — M722 Plantar fascial fibromatosis: Secondary | ICD-10-CM | POA: Diagnosis not present

## 2011-11-26 DIAGNOSIS — M65979 Unspecified synovitis and tenosynovitis, unspecified ankle and foot: Secondary | ICD-10-CM | POA: Diagnosis not present

## 2011-11-26 DIAGNOSIS — M659 Synovitis and tenosynovitis, unspecified: Secondary | ICD-10-CM | POA: Diagnosis not present

## 2011-11-26 DIAGNOSIS — M7150 Other bursitis, not elsewhere classified, unspecified site: Secondary | ICD-10-CM | POA: Diagnosis not present

## 2011-12-10 DIAGNOSIS — M659 Synovitis and tenosynovitis, unspecified: Secondary | ICD-10-CM | POA: Diagnosis not present

## 2011-12-10 DIAGNOSIS — M722 Plantar fascial fibromatosis: Secondary | ICD-10-CM | POA: Diagnosis not present

## 2011-12-10 DIAGNOSIS — M65979 Unspecified synovitis and tenosynovitis, unspecified ankle and foot: Secondary | ICD-10-CM | POA: Diagnosis not present

## 2011-12-10 DIAGNOSIS — M7150 Other bursitis, not elsewhere classified, unspecified site: Secondary | ICD-10-CM | POA: Diagnosis not present

## 2011-12-12 ENCOUNTER — Ambulatory Visit (HOSPITAL_COMMUNITY)
Admission: RE | Admit: 2011-12-12 | Discharge: 2011-12-12 | Disposition: A | Discharge status: Home / Self Care | Payer: Medicare Other | Source: Ambulatory Visit | Attending: Family Medicine | Admitting: Family Medicine

## 2011-12-12 DIAGNOSIS — Z1231 Encounter for screening mammogram for malignant neoplasm of breast: Secondary | ICD-10-CM | POA: Diagnosis not present

## 2011-12-17 ENCOUNTER — Other Ambulatory Visit: Payer: Self-pay | Admitting: Family Medicine

## 2011-12-17 DIAGNOSIS — M722 Plantar fascial fibromatosis: Secondary | ICD-10-CM | POA: Diagnosis not present

## 2011-12-17 DIAGNOSIS — M7150 Other bursitis, not elsewhere classified, unspecified site: Secondary | ICD-10-CM | POA: Diagnosis not present

## 2011-12-17 DIAGNOSIS — R928 Other abnormal and inconclusive findings on diagnostic imaging of breast: Secondary | ICD-10-CM

## 2011-12-17 DIAGNOSIS — M65979 Unspecified synovitis and tenosynovitis, unspecified ankle and foot: Secondary | ICD-10-CM | POA: Diagnosis not present

## 2011-12-17 DIAGNOSIS — M659 Synovitis and tenosynovitis, unspecified: Secondary | ICD-10-CM | POA: Diagnosis not present

## 2011-12-19 ENCOUNTER — Ambulatory Visit
Admission: RE | Admit: 2011-12-19 | Discharge: 2011-12-19 | Disposition: A | Discharge status: Home / Self Care | Payer: Medicare Other | Source: Ambulatory Visit | Attending: Family Medicine | Admitting: Family Medicine

## 2011-12-19 ENCOUNTER — Other Ambulatory Visit: Payer: Self-pay | Admitting: Family Medicine

## 2011-12-19 DIAGNOSIS — R928 Other abnormal and inconclusive findings on diagnostic imaging of breast: Secondary | ICD-10-CM

## 2011-12-23 ENCOUNTER — Ambulatory Visit
Admission: RE | Admit: 2011-12-23 | Discharge: 2011-12-23 | Disposition: A | Discharge status: Home / Self Care | Payer: Medicare Other | Source: Ambulatory Visit | Attending: Family Medicine | Admitting: Family Medicine

## 2011-12-23 DIAGNOSIS — D249 Benign neoplasm of unspecified breast: Secondary | ICD-10-CM | POA: Diagnosis not present

## 2011-12-23 DIAGNOSIS — R928 Other abnormal and inconclusive findings on diagnostic imaging of breast: Secondary | ICD-10-CM

## 2011-12-23 DIAGNOSIS — N6489 Other specified disorders of breast: Secondary | ICD-10-CM | POA: Diagnosis not present

## 2011-12-23 DIAGNOSIS — N6459 Other signs and symptoms in breast: Secondary | ICD-10-CM | POA: Diagnosis not present

## 2011-12-23 DIAGNOSIS — N641 Fat necrosis of breast: Secondary | ICD-10-CM | POA: Diagnosis not present

## 2012-02-20 DIAGNOSIS — E785 Hyperlipidemia, unspecified: Secondary | ICD-10-CM | POA: Diagnosis not present

## 2012-02-20 DIAGNOSIS — I10 Essential (primary) hypertension: Secondary | ICD-10-CM | POA: Diagnosis not present

## 2012-02-20 DIAGNOSIS — E038 Other specified hypothyroidism: Secondary | ICD-10-CM | POA: Diagnosis not present

## 2012-06-23 DIAGNOSIS — I8310 Varicose veins of unspecified lower extremity with inflammation: Secondary | ICD-10-CM | POA: Diagnosis not present

## 2012-06-29 DIAGNOSIS — I8310 Varicose veins of unspecified lower extremity with inflammation: Secondary | ICD-10-CM | POA: Diagnosis not present

## 2012-07-14 DIAGNOSIS — I8310 Varicose veins of unspecified lower extremity with inflammation: Secondary | ICD-10-CM | POA: Diagnosis not present

## 2012-09-07 DIAGNOSIS — M79609 Pain in unspecified limb: Secondary | ICD-10-CM | POA: Diagnosis not present

## 2012-09-07 DIAGNOSIS — I8310 Varicose veins of unspecified lower extremity with inflammation: Secondary | ICD-10-CM | POA: Diagnosis not present

## 2012-09-07 DIAGNOSIS — I872 Venous insufficiency (chronic) (peripheral): Secondary | ICD-10-CM | POA: Diagnosis not present

## 2012-09-09 DIAGNOSIS — I872 Venous insufficiency (chronic) (peripheral): Secondary | ICD-10-CM | POA: Diagnosis not present

## 2012-09-09 DIAGNOSIS — M79609 Pain in unspecified limb: Secondary | ICD-10-CM | POA: Diagnosis not present

## 2012-09-09 DIAGNOSIS — I8310 Varicose veins of unspecified lower extremity with inflammation: Secondary | ICD-10-CM | POA: Diagnosis not present

## 2012-09-22 DIAGNOSIS — E038 Other specified hypothyroidism: Secondary | ICD-10-CM | POA: Diagnosis not present

## 2012-09-22 DIAGNOSIS — I10 Essential (primary) hypertension: Secondary | ICD-10-CM | POA: Diagnosis not present

## 2012-09-22 DIAGNOSIS — I6990 Unspecified sequelae of unspecified cerebrovascular disease: Secondary | ICD-10-CM | POA: Diagnosis not present

## 2012-09-22 DIAGNOSIS — E559 Vitamin D deficiency, unspecified: Secondary | ICD-10-CM | POA: Diagnosis not present

## 2012-09-22 DIAGNOSIS — E785 Hyperlipidemia, unspecified: Secondary | ICD-10-CM | POA: Diagnosis not present

## 2012-09-22 DIAGNOSIS — F329 Major depressive disorder, single episode, unspecified: Secondary | ICD-10-CM | POA: Diagnosis not present

## 2012-09-23 DIAGNOSIS — I8310 Varicose veins of unspecified lower extremity with inflammation: Secondary | ICD-10-CM | POA: Diagnosis not present

## 2012-09-23 DIAGNOSIS — M79609 Pain in unspecified limb: Secondary | ICD-10-CM | POA: Diagnosis not present

## 2012-10-07 DIAGNOSIS — M79609 Pain in unspecified limb: Secondary | ICD-10-CM | POA: Diagnosis not present

## 2012-10-07 DIAGNOSIS — I8310 Varicose veins of unspecified lower extremity with inflammation: Secondary | ICD-10-CM | POA: Diagnosis not present

## 2012-10-27 DIAGNOSIS — I8310 Varicose veins of unspecified lower extremity with inflammation: Secondary | ICD-10-CM | POA: Diagnosis not present

## 2012-10-27 DIAGNOSIS — M79609 Pain in unspecified limb: Secondary | ICD-10-CM | POA: Diagnosis not present

## 2012-11-16 DIAGNOSIS — Z23 Encounter for immunization: Secondary | ICD-10-CM | POA: Diagnosis not present

## 2012-12-04 DIAGNOSIS — H2510 Age-related nuclear cataract, unspecified eye: Secondary | ICD-10-CM | POA: Diagnosis not present

## 2012-12-06 ENCOUNTER — Other Ambulatory Visit (HOSPITAL_COMMUNITY): Payer: Self-pay | Admitting: Family Medicine

## 2012-12-06 DIAGNOSIS — Z1231 Encounter for screening mammogram for malignant neoplasm of breast: Secondary | ICD-10-CM

## 2012-12-13 ENCOUNTER — Ambulatory Visit (HOSPITAL_COMMUNITY)
Admission: RE | Admit: 2012-12-13 | Discharge: 2012-12-13 | Disposition: A | Discharge status: Home / Self Care | Payer: Medicare Other | Source: Ambulatory Visit | Attending: Family Medicine | Admitting: Family Medicine

## 2012-12-13 DIAGNOSIS — Z1231 Encounter for screening mammogram for malignant neoplasm of breast: Secondary | ICD-10-CM | POA: Diagnosis not present

## 2013-01-11 DIAGNOSIS — M659 Synovitis and tenosynovitis, unspecified: Secondary | ICD-10-CM | POA: Diagnosis not present

## 2013-01-11 DIAGNOSIS — R209 Unspecified disturbances of skin sensation: Secondary | ICD-10-CM | POA: Diagnosis not present

## 2013-01-11 DIAGNOSIS — M722 Plantar fascial fibromatosis: Secondary | ICD-10-CM | POA: Diagnosis not present

## 2013-01-11 DIAGNOSIS — G609 Hereditary and idiopathic neuropathy, unspecified: Secondary | ICD-10-CM | POA: Diagnosis not present

## 2013-02-01 DIAGNOSIS — IMO0002 Reserved for concepts with insufficient information to code with codable children: Secondary | ICD-10-CM | POA: Diagnosis not present

## 2013-02-09 DIAGNOSIS — E559 Vitamin D deficiency, unspecified: Secondary | ICD-10-CM | POA: Diagnosis not present

## 2013-02-09 DIAGNOSIS — I10 Essential (primary) hypertension: Secondary | ICD-10-CM | POA: Diagnosis not present

## 2013-02-09 DIAGNOSIS — E039 Hypothyroidism, unspecified: Secondary | ICD-10-CM | POA: Diagnosis not present

## 2013-03-07 DIAGNOSIS — Z96659 Presence of unspecified artificial knee joint: Secondary | ICD-10-CM | POA: Diagnosis not present

## 2013-03-07 DIAGNOSIS — M25569 Pain in unspecified knee: Secondary | ICD-10-CM | POA: Diagnosis not present

## 2013-03-08 ENCOUNTER — Other Ambulatory Visit (HOSPITAL_COMMUNITY): Payer: Self-pay | Admitting: Orthopedic Surgery

## 2013-03-08 DIAGNOSIS — M25562 Pain in left knee: Secondary | ICD-10-CM

## 2013-03-21 ENCOUNTER — Encounter (HOSPITAL_COMMUNITY)
Admission: RE | Admit: 2013-03-21 | Discharge: 2013-03-21 | Disposition: A | Discharge status: Home / Self Care | Payer: Medicare Other | Source: Ambulatory Visit | Attending: Orthopedic Surgery | Admitting: Orthopedic Surgery

## 2013-03-21 DIAGNOSIS — M25562 Pain in left knee: Secondary | ICD-10-CM

## 2013-03-21 DIAGNOSIS — M25569 Pain in unspecified knee: Secondary | ICD-10-CM | POA: Insufficient documentation

## 2013-03-21 DIAGNOSIS — Z471 Aftercare following joint replacement surgery: Secondary | ICD-10-CM | POA: Diagnosis not present

## 2013-03-21 DIAGNOSIS — Z96659 Presence of unspecified artificial knee joint: Secondary | ICD-10-CM | POA: Diagnosis not present

## 2013-03-21 DIAGNOSIS — M1710 Unilateral primary osteoarthritis, unspecified knee: Secondary | ICD-10-CM | POA: Diagnosis not present

## 2013-03-21 DIAGNOSIS — IMO0002 Reserved for concepts with insufficient information to code with codable children: Secondary | ICD-10-CM | POA: Diagnosis not present

## 2013-03-21 MED ORDER — TECHNETIUM TC 99M MEDRONATE IV KIT
25.0000 | PACK | Freq: Once | INTRAVENOUS | Status: AC | PRN
  Administered 2013-03-21: 25 via INTRAVENOUS

## 2013-03-25 DIAGNOSIS — Z96659 Presence of unspecified artificial knee joint: Secondary | ICD-10-CM | POA: Diagnosis not present

## 2013-03-25 DIAGNOSIS — M25569 Pain in unspecified knee: Secondary | ICD-10-CM | POA: Diagnosis not present

## 2013-04-22 DIAGNOSIS — I6990 Unspecified sequelae of unspecified cerebrovascular disease: Secondary | ICD-10-CM | POA: Diagnosis not present

## 2013-04-22 DIAGNOSIS — E038 Other specified hypothyroidism: Secondary | ICD-10-CM | POA: Diagnosis not present

## 2013-04-22 DIAGNOSIS — M159 Polyosteoarthritis, unspecified: Secondary | ICD-10-CM | POA: Diagnosis not present

## 2013-04-22 DIAGNOSIS — E559 Vitamin D deficiency, unspecified: Secondary | ICD-10-CM | POA: Diagnosis not present

## 2013-04-22 DIAGNOSIS — E785 Hyperlipidemia, unspecified: Secondary | ICD-10-CM | POA: Diagnosis not present

## 2013-04-22 DIAGNOSIS — D126 Benign neoplasm of colon, unspecified: Secondary | ICD-10-CM | POA: Diagnosis not present

## 2013-04-22 DIAGNOSIS — Z23 Encounter for immunization: Secondary | ICD-10-CM | POA: Diagnosis not present

## 2013-04-22 DIAGNOSIS — Z0000 Encounter for general adult medical examination without abnormal findings: Secondary | ICD-10-CM | POA: Diagnosis not present

## 2013-04-22 DIAGNOSIS — I10 Essential (primary) hypertension: Secondary | ICD-10-CM | POA: Diagnosis not present

## 2013-10-25 DIAGNOSIS — E038 Other specified hypothyroidism: Secondary | ICD-10-CM | POA: Diagnosis not present

## 2013-10-25 DIAGNOSIS — F329 Major depressive disorder, single episode, unspecified: Secondary | ICD-10-CM | POA: Diagnosis not present

## 2013-10-25 DIAGNOSIS — E559 Vitamin D deficiency, unspecified: Secondary | ICD-10-CM | POA: Diagnosis not present

## 2013-10-25 DIAGNOSIS — I10 Essential (primary) hypertension: Secondary | ICD-10-CM | POA: Diagnosis not present

## 2013-10-25 DIAGNOSIS — E785 Hyperlipidemia, unspecified: Secondary | ICD-10-CM | POA: Diagnosis not present

## 2013-12-01 ENCOUNTER — Other Ambulatory Visit (HOSPITAL_COMMUNITY): Payer: Self-pay | Admitting: Family Medicine

## 2013-12-01 DIAGNOSIS — Z1231 Encounter for screening mammogram for malignant neoplasm of breast: Secondary | ICD-10-CM

## 2013-12-08 DIAGNOSIS — Z23 Encounter for immunization: Secondary | ICD-10-CM | POA: Diagnosis not present

## 2013-12-09 DIAGNOSIS — H2513 Age-related nuclear cataract, bilateral: Secondary | ICD-10-CM | POA: Diagnosis not present

## 2013-12-14 ENCOUNTER — Ambulatory Visit (HOSPITAL_COMMUNITY)
Admission: RE | Admit: 2013-12-14 | Discharge: 2013-12-14 | Disposition: A | Discharge status: Home / Self Care | Payer: Medicare Other | Source: Ambulatory Visit | Attending: Family Medicine | Admitting: Family Medicine

## 2013-12-14 DIAGNOSIS — Z1231 Encounter for screening mammogram for malignant neoplasm of breast: Secondary | ICD-10-CM | POA: Insufficient documentation

## 2014-04-14 DIAGNOSIS — Z96652 Presence of left artificial knee joint: Secondary | ICD-10-CM | POA: Diagnosis not present

## 2014-04-14 DIAGNOSIS — M25562 Pain in left knee: Secondary | ICD-10-CM | POA: Diagnosis not present

## 2014-04-14 DIAGNOSIS — M545 Low back pain: Secondary | ICD-10-CM | POA: Diagnosis not present

## 2014-04-24 DIAGNOSIS — M25562 Pain in left knee: Secondary | ICD-10-CM | POA: Diagnosis not present

## 2014-04-24 DIAGNOSIS — M545 Low back pain: Secondary | ICD-10-CM | POA: Diagnosis not present

## 2014-04-24 DIAGNOSIS — G8929 Other chronic pain: Secondary | ICD-10-CM | POA: Diagnosis not present

## 2014-05-01 DIAGNOSIS — M545 Low back pain: Secondary | ICD-10-CM | POA: Diagnosis not present

## 2014-05-03 DIAGNOSIS — M25562 Pain in left knee: Secondary | ICD-10-CM | POA: Diagnosis not present

## 2014-05-03 DIAGNOSIS — G8929 Other chronic pain: Secondary | ICD-10-CM | POA: Diagnosis not present

## 2014-05-03 DIAGNOSIS — M545 Low back pain: Secondary | ICD-10-CM | POA: Diagnosis not present

## 2014-05-08 DIAGNOSIS — G8929 Other chronic pain: Secondary | ICD-10-CM | POA: Diagnosis not present

## 2014-05-08 DIAGNOSIS — M545 Low back pain: Secondary | ICD-10-CM | POA: Diagnosis not present

## 2014-05-08 DIAGNOSIS — M25562 Pain in left knee: Secondary | ICD-10-CM | POA: Diagnosis not present

## 2014-05-10 DIAGNOSIS — G8929 Other chronic pain: Secondary | ICD-10-CM | POA: Diagnosis not present

## 2014-05-10 DIAGNOSIS — M25562 Pain in left knee: Secondary | ICD-10-CM | POA: Diagnosis not present

## 2014-05-11 DIAGNOSIS — Z96652 Presence of left artificial knee joint: Secondary | ICD-10-CM | POA: Diagnosis not present

## 2014-05-11 DIAGNOSIS — Z471 Aftercare following joint replacement surgery: Secondary | ICD-10-CM | POA: Diagnosis not present

## 2014-05-15 DIAGNOSIS — G8929 Other chronic pain: Secondary | ICD-10-CM | POA: Diagnosis not present

## 2014-05-15 DIAGNOSIS — M25562 Pain in left knee: Secondary | ICD-10-CM | POA: Diagnosis not present

## 2014-05-17 DIAGNOSIS — M25562 Pain in left knee: Secondary | ICD-10-CM | POA: Diagnosis not present

## 2014-05-17 DIAGNOSIS — G8929 Other chronic pain: Secondary | ICD-10-CM | POA: Diagnosis not present

## 2014-05-22 DIAGNOSIS — G8929 Other chronic pain: Secondary | ICD-10-CM | POA: Diagnosis not present

## 2014-05-22 DIAGNOSIS — M25562 Pain in left knee: Secondary | ICD-10-CM | POA: Diagnosis not present

## 2014-05-24 DIAGNOSIS — M542 Cervicalgia: Secondary | ICD-10-CM | POA: Diagnosis not present

## 2014-05-24 DIAGNOSIS — M25562 Pain in left knee: Secondary | ICD-10-CM | POA: Diagnosis not present

## 2014-05-31 DIAGNOSIS — M545 Low back pain: Secondary | ICD-10-CM | POA: Diagnosis not present

## 2014-05-31 DIAGNOSIS — M25562 Pain in left knee: Secondary | ICD-10-CM | POA: Diagnosis not present

## 2014-05-31 DIAGNOSIS — G8929 Other chronic pain: Secondary | ICD-10-CM | POA: Diagnosis not present

## 2014-06-02 DIAGNOSIS — G8929 Other chronic pain: Secondary | ICD-10-CM | POA: Diagnosis not present

## 2014-06-02 DIAGNOSIS — M25562 Pain in left knee: Secondary | ICD-10-CM | POA: Diagnosis not present

## 2014-06-05 DIAGNOSIS — M545 Low back pain: Secondary | ICD-10-CM | POA: Diagnosis not present

## 2014-06-07 DIAGNOSIS — M545 Low back pain: Secondary | ICD-10-CM | POA: Diagnosis not present

## 2014-06-12 DIAGNOSIS — M545 Low back pain: Secondary | ICD-10-CM | POA: Diagnosis not present

## 2014-06-14 DIAGNOSIS — M545 Low back pain: Secondary | ICD-10-CM | POA: Diagnosis not present

## 2014-06-19 DIAGNOSIS — G8929 Other chronic pain: Secondary | ICD-10-CM | POA: Diagnosis not present

## 2014-06-19 DIAGNOSIS — M25562 Pain in left knee: Secondary | ICD-10-CM | POA: Diagnosis not present

## 2014-06-21 DIAGNOSIS — M25562 Pain in left knee: Secondary | ICD-10-CM | POA: Diagnosis not present

## 2014-06-21 DIAGNOSIS — G8929 Other chronic pain: Secondary | ICD-10-CM | POA: Diagnosis not present

## 2014-06-26 DIAGNOSIS — M545 Low back pain: Secondary | ICD-10-CM | POA: Diagnosis not present

## 2014-06-26 DIAGNOSIS — G8929 Other chronic pain: Secondary | ICD-10-CM | POA: Diagnosis not present

## 2014-06-26 DIAGNOSIS — M25562 Pain in left knee: Secondary | ICD-10-CM | POA: Diagnosis not present

## 2014-07-07 DIAGNOSIS — Z471 Aftercare following joint replacement surgery: Secondary | ICD-10-CM | POA: Diagnosis not present

## 2014-07-07 DIAGNOSIS — Z96652 Presence of left artificial knee joint: Secondary | ICD-10-CM | POA: Diagnosis not present

## 2014-07-19 DIAGNOSIS — E785 Hyperlipidemia, unspecified: Secondary | ICD-10-CM | POA: Diagnosis not present

## 2014-07-19 DIAGNOSIS — I10 Essential (primary) hypertension: Secondary | ICD-10-CM | POA: Diagnosis not present

## 2014-07-19 DIAGNOSIS — F5105 Insomnia due to other mental disorder: Secondary | ICD-10-CM | POA: Diagnosis not present

## 2014-07-19 DIAGNOSIS — M150 Primary generalized (osteo)arthritis: Secondary | ICD-10-CM | POA: Diagnosis not present

## 2014-07-19 DIAGNOSIS — F329 Major depressive disorder, single episode, unspecified: Secondary | ICD-10-CM | POA: Diagnosis not present

## 2014-07-19 DIAGNOSIS — E038 Other specified hypothyroidism: Secondary | ICD-10-CM | POA: Diagnosis not present

## 2014-07-19 DIAGNOSIS — Z0000 Encounter for general adult medical examination without abnormal findings: Secondary | ICD-10-CM | POA: Diagnosis not present

## 2014-07-19 DIAGNOSIS — D126 Benign neoplasm of colon, unspecified: Secondary | ICD-10-CM | POA: Diagnosis not present

## 2014-07-19 DIAGNOSIS — I6990 Unspecified sequelae of unspecified cerebrovascular disease: Secondary | ICD-10-CM | POA: Diagnosis not present

## 2014-07-19 DIAGNOSIS — E559 Vitamin D deficiency, unspecified: Secondary | ICD-10-CM | POA: Diagnosis not present

## 2014-08-21 DIAGNOSIS — Z780 Asymptomatic menopausal state: Secondary | ICD-10-CM | POA: Diagnosis not present

## 2014-09-18 DIAGNOSIS — M79609 Pain in unspecified limb: Secondary | ICD-10-CM | POA: Diagnosis not present

## 2014-09-19 DIAGNOSIS — I87323 Chronic venous hypertension (idiopathic) with inflammation of bilateral lower extremity: Secondary | ICD-10-CM | POA: Diagnosis not present

## 2014-12-11 DIAGNOSIS — M2041 Other hammer toe(s) (acquired), right foot: Secondary | ICD-10-CM | POA: Diagnosis not present

## 2014-12-11 DIAGNOSIS — L03031 Cellulitis of right toe: Secondary | ICD-10-CM | POA: Diagnosis not present

## 2014-12-11 DIAGNOSIS — M71571 Other bursitis, not elsewhere classified, right ankle and foot: Secondary | ICD-10-CM | POA: Diagnosis not present

## 2014-12-13 DIAGNOSIS — Z23 Encounter for immunization: Secondary | ICD-10-CM | POA: Diagnosis not present

## 2014-12-15 DIAGNOSIS — H2513 Age-related nuclear cataract, bilateral: Secondary | ICD-10-CM | POA: Diagnosis not present

## 2014-12-20 DIAGNOSIS — L03031 Cellulitis of right toe: Secondary | ICD-10-CM | POA: Diagnosis not present

## 2015-01-22 DIAGNOSIS — E785 Hyperlipidemia, unspecified: Secondary | ICD-10-CM | POA: Diagnosis not present

## 2015-01-22 DIAGNOSIS — I6990 Unspecified sequelae of unspecified cerebrovascular disease: Secondary | ICD-10-CM | POA: Diagnosis not present

## 2015-01-22 DIAGNOSIS — I10 Essential (primary) hypertension: Secondary | ICD-10-CM | POA: Diagnosis not present

## 2015-01-22 DIAGNOSIS — E038 Other specified hypothyroidism: Secondary | ICD-10-CM | POA: Diagnosis not present

## 2015-01-22 DIAGNOSIS — F329 Major depressive disorder, single episode, unspecified: Secondary | ICD-10-CM | POA: Diagnosis not present

## 2015-01-22 DIAGNOSIS — E559 Vitamin D deficiency, unspecified: Secondary | ICD-10-CM | POA: Diagnosis not present

## 2015-02-06 ENCOUNTER — Other Ambulatory Visit: Payer: Self-pay

## 2015-02-06 DIAGNOSIS — Z1231 Encounter for screening mammogram for malignant neoplasm of breast: Secondary | ICD-10-CM

## 2015-03-14 ENCOUNTER — Ambulatory Visit
Admission: RE | Admit: 2015-03-14 | Discharge: 2015-03-14 | Disposition: A | Discharge status: Home / Self Care | Payer: Medicare Other | Source: Ambulatory Visit

## 2015-03-14 DIAGNOSIS — Z1231 Encounter for screening mammogram for malignant neoplasm of breast: Secondary | ICD-10-CM | POA: Diagnosis not present

## 2015-04-25 DIAGNOSIS — E559 Vitamin D deficiency, unspecified: Secondary | ICD-10-CM | POA: Diagnosis not present

## 2015-05-16 DIAGNOSIS — J45909 Unspecified asthma, uncomplicated: Secondary | ICD-10-CM | POA: Diagnosis not present

## 2015-05-16 DIAGNOSIS — B349 Viral infection, unspecified: Secondary | ICD-10-CM | POA: Diagnosis not present

## 2015-05-16 DIAGNOSIS — J111 Influenza due to unidentified influenza virus with other respiratory manifestations: Secondary | ICD-10-CM | POA: Diagnosis not present

## 2015-05-16 DIAGNOSIS — R350 Frequency of micturition: Secondary | ICD-10-CM | POA: Diagnosis not present

## 2015-05-23 DIAGNOSIS — I10 Essential (primary) hypertension: Secondary | ICD-10-CM | POA: Diagnosis not present

## 2015-05-23 DIAGNOSIS — N390 Urinary tract infection, site not specified: Secondary | ICD-10-CM | POA: Diagnosis not present

## 2015-05-23 DIAGNOSIS — F329 Major depressive disorder, single episode, unspecified: Secondary | ICD-10-CM | POA: Diagnosis not present

## 2015-05-23 DIAGNOSIS — F5105 Insomnia due to other mental disorder: Secondary | ICD-10-CM | POA: Diagnosis not present

## 2015-07-24 DIAGNOSIS — E038 Other specified hypothyroidism: Secondary | ICD-10-CM | POA: Diagnosis not present

## 2015-07-24 DIAGNOSIS — F329 Major depressive disorder, single episode, unspecified: Secondary | ICD-10-CM | POA: Diagnosis not present

## 2015-07-24 DIAGNOSIS — F5105 Insomnia due to other mental disorder: Secondary | ICD-10-CM | POA: Diagnosis not present

## 2015-07-24 DIAGNOSIS — E785 Hyperlipidemia, unspecified: Secondary | ICD-10-CM | POA: Diagnosis not present

## 2015-07-24 DIAGNOSIS — M150 Primary generalized (osteo)arthritis: Secondary | ICD-10-CM | POA: Diagnosis not present

## 2015-07-24 DIAGNOSIS — Z0000 Encounter for general adult medical examination without abnormal findings: Secondary | ICD-10-CM | POA: Diagnosis not present

## 2015-07-24 DIAGNOSIS — H9190 Unspecified hearing loss, unspecified ear: Secondary | ICD-10-CM | POA: Diagnosis not present

## 2015-07-24 DIAGNOSIS — I6990 Unspecified sequelae of unspecified cerebrovascular disease: Secondary | ICD-10-CM | POA: Diagnosis not present

## 2015-07-24 DIAGNOSIS — E559 Vitamin D deficiency, unspecified: Secondary | ICD-10-CM | POA: Diagnosis not present

## 2015-07-24 DIAGNOSIS — I10 Essential (primary) hypertension: Secondary | ICD-10-CM | POA: Diagnosis not present

## 2015-07-24 DIAGNOSIS — J45909 Unspecified asthma, uncomplicated: Secondary | ICD-10-CM | POA: Diagnosis not present

## 2015-07-24 DIAGNOSIS — K5900 Constipation, unspecified: Secondary | ICD-10-CM | POA: Diagnosis not present

## 2015-08-14 DIAGNOSIS — H903 Sensorineural hearing loss, bilateral: Secondary | ICD-10-CM | POA: Diagnosis not present

## 2015-09-21 ENCOUNTER — Ambulatory Visit: Payer: Medicare Other | Admitting: Neurology

## 2015-09-21 ENCOUNTER — Other Ambulatory Visit: Payer: Self-pay

## 2015-09-21 DIAGNOSIS — Z029 Encounter for administrative examinations, unspecified: Secondary | ICD-10-CM

## 2015-10-04 DIAGNOSIS — Z79899 Other long term (current) drug therapy: Secondary | ICD-10-CM | POA: Diagnosis not present

## 2015-10-04 DIAGNOSIS — E559 Vitamin D deficiency, unspecified: Secondary | ICD-10-CM | POA: Diagnosis not present

## 2015-12-16 DIAGNOSIS — H2513 Age-related nuclear cataract, bilateral: Secondary | ICD-10-CM | POA: Diagnosis not present

## 2016-01-15 DIAGNOSIS — E785 Hyperlipidemia, unspecified: Secondary | ICD-10-CM | POA: Diagnosis not present

## 2016-01-15 DIAGNOSIS — R350 Frequency of micturition: Secondary | ICD-10-CM | POA: Diagnosis not present

## 2016-01-15 DIAGNOSIS — E038 Other specified hypothyroidism: Secondary | ICD-10-CM | POA: Diagnosis not present

## 2016-01-15 DIAGNOSIS — Z23 Encounter for immunization: Secondary | ICD-10-CM | POA: Diagnosis not present

## 2016-01-15 DIAGNOSIS — J069 Acute upper respiratory infection, unspecified: Secondary | ICD-10-CM | POA: Diagnosis not present

## 2016-01-15 DIAGNOSIS — R5383 Other fatigue: Secondary | ICD-10-CM | POA: Diagnosis not present

## 2016-01-15 DIAGNOSIS — I10 Essential (primary) hypertension: Secondary | ICD-10-CM | POA: Diagnosis not present

## 2016-06-26 ENCOUNTER — Ambulatory Visit
Admission: RE | Admit: 2016-06-26 | Discharge: 2016-06-26 | Disposition: A | Discharge status: Home / Self Care | Payer: Medicare Other | Source: Ambulatory Visit | Attending: Family Medicine | Admitting: Family Medicine

## 2016-06-26 ENCOUNTER — Other Ambulatory Visit: Payer: Self-pay | Admitting: Family Medicine

## 2016-06-26 DIAGNOSIS — K5900 Constipation, unspecified: Secondary | ICD-10-CM | POA: Diagnosis not present

## 2016-06-26 DIAGNOSIS — R05 Cough: Secondary | ICD-10-CM

## 2016-06-26 DIAGNOSIS — K219 Gastro-esophageal reflux disease without esophagitis: Secondary | ICD-10-CM | POA: Diagnosis not present

## 2016-06-26 DIAGNOSIS — R059 Cough, unspecified: Secondary | ICD-10-CM

## 2016-08-29 DIAGNOSIS — E559 Vitamin D deficiency, unspecified: Secondary | ICD-10-CM | POA: Diagnosis not present

## 2016-08-29 DIAGNOSIS — H9190 Unspecified hearing loss, unspecified ear: Secondary | ICD-10-CM | POA: Diagnosis not present

## 2016-08-29 DIAGNOSIS — J45909 Unspecified asthma, uncomplicated: Secondary | ICD-10-CM | POA: Diagnosis not present

## 2016-08-29 DIAGNOSIS — K5900 Constipation, unspecified: Secondary | ICD-10-CM | POA: Diagnosis not present

## 2016-08-29 DIAGNOSIS — F5105 Insomnia due to other mental disorder: Secondary | ICD-10-CM | POA: Diagnosis not present

## 2016-08-29 DIAGNOSIS — E785 Hyperlipidemia, unspecified: Secondary | ICD-10-CM | POA: Diagnosis not present

## 2016-08-29 DIAGNOSIS — F329 Major depressive disorder, single episode, unspecified: Secondary | ICD-10-CM | POA: Diagnosis not present

## 2016-08-29 DIAGNOSIS — E038 Other specified hypothyroidism: Secondary | ICD-10-CM | POA: Diagnosis not present

## 2016-08-29 DIAGNOSIS — Z0000 Encounter for general adult medical examination without abnormal findings: Secondary | ICD-10-CM | POA: Diagnosis not present

## 2016-08-29 DIAGNOSIS — M150 Primary generalized (osteo)arthritis: Secondary | ICD-10-CM | POA: Diagnosis not present

## 2016-08-29 DIAGNOSIS — I10 Essential (primary) hypertension: Secondary | ICD-10-CM | POA: Diagnosis not present

## 2016-08-29 DIAGNOSIS — I6990 Unspecified sequelae of unspecified cerebrovascular disease: Secondary | ICD-10-CM | POA: Diagnosis not present

## 2016-11-27 ENCOUNTER — Other Ambulatory Visit: Payer: Self-pay | Admitting: Family Medicine

## 2016-11-27 DIAGNOSIS — Z1231 Encounter for screening mammogram for malignant neoplasm of breast: Secondary | ICD-10-CM

## 2016-12-05 ENCOUNTER — Ambulatory Visit
Admission: RE | Admit: 2016-12-05 | Discharge: 2016-12-05 | Disposition: A | Discharge status: Home / Self Care | Payer: Medicare Other | Source: Ambulatory Visit | Attending: Family Medicine | Admitting: Family Medicine

## 2016-12-05 DIAGNOSIS — Z1231 Encounter for screening mammogram for malignant neoplasm of breast: Secondary | ICD-10-CM | POA: Diagnosis not present

## 2016-12-15 DIAGNOSIS — M545 Low back pain: Secondary | ICD-10-CM | POA: Diagnosis not present

## 2016-12-15 DIAGNOSIS — Z96652 Presence of left artificial knee joint: Secondary | ICD-10-CM | POA: Diagnosis not present

## 2016-12-15 DIAGNOSIS — Z471 Aftercare following joint replacement surgery: Secondary | ICD-10-CM | POA: Diagnosis not present

## 2016-12-16 DIAGNOSIS — Z23 Encounter for immunization: Secondary | ICD-10-CM | POA: Diagnosis not present

## 2016-12-23 DIAGNOSIS — M545 Low back pain: Secondary | ICD-10-CM | POA: Diagnosis not present

## 2016-12-26 DIAGNOSIS — M545 Low back pain: Secondary | ICD-10-CM | POA: Diagnosis not present

## 2016-12-26 DIAGNOSIS — M5136 Other intervertebral disc degeneration, lumbar region: Secondary | ICD-10-CM | POA: Diagnosis not present

## 2017-01-15 DIAGNOSIS — M5416 Radiculopathy, lumbar region: Secondary | ICD-10-CM | POA: Diagnosis not present

## 2017-02-05 DIAGNOSIS — M5416 Radiculopathy, lumbar region: Secondary | ICD-10-CM | POA: Diagnosis not present

## 2017-03-11 DIAGNOSIS — L03032 Cellulitis of left toe: Secondary | ICD-10-CM | POA: Diagnosis not present

## 2017-03-25 DIAGNOSIS — L600 Ingrowing nail: Secondary | ICD-10-CM | POA: Diagnosis not present

## 2017-03-25 DIAGNOSIS — T8189XA Other complications of procedures, not elsewhere classified, initial encounter: Secondary | ICD-10-CM | POA: Diagnosis not present

## 2017-04-08 DIAGNOSIS — T8189XA Other complications of procedures, not elsewhere classified, initial encounter: Secondary | ICD-10-CM | POA: Diagnosis not present

## 2017-04-22 DIAGNOSIS — H2513 Age-related nuclear cataract, bilateral: Secondary | ICD-10-CM | POA: Diagnosis not present

## 2017-05-05 DIAGNOSIS — M5136 Other intervertebral disc degeneration, lumbar region: Secondary | ICD-10-CM | POA: Diagnosis not present

## 2017-05-06 DIAGNOSIS — E038 Other specified hypothyroidism: Secondary | ICD-10-CM | POA: Diagnosis not present

## 2017-05-06 DIAGNOSIS — E785 Hyperlipidemia, unspecified: Secondary | ICD-10-CM | POA: Diagnosis not present

## 2017-05-06 DIAGNOSIS — I10 Essential (primary) hypertension: Secondary | ICD-10-CM | POA: Diagnosis not present

## 2017-05-06 DIAGNOSIS — M150 Primary generalized (osteo)arthritis: Secondary | ICD-10-CM | POA: Diagnosis not present

## 2017-05-06 DIAGNOSIS — Z7189 Other specified counseling: Secondary | ICD-10-CM | POA: Diagnosis not present

## 2017-05-06 DIAGNOSIS — Z6831 Body mass index (BMI) 31.0-31.9, adult: Secondary | ICD-10-CM | POA: Diagnosis not present

## 2017-11-25 DIAGNOSIS — Z23 Encounter for immunization: Secondary | ICD-10-CM | POA: Diagnosis not present

## 2017-12-02 DIAGNOSIS — M150 Primary generalized (osteo)arthritis: Secondary | ICD-10-CM | POA: Diagnosis not present

## 2017-12-02 DIAGNOSIS — E559 Vitamin D deficiency, unspecified: Secondary | ICD-10-CM | POA: Diagnosis not present

## 2017-12-02 DIAGNOSIS — I10 Essential (primary) hypertension: Secondary | ICD-10-CM | POA: Diagnosis not present

## 2017-12-02 DIAGNOSIS — Z0000 Encounter for general adult medical examination without abnormal findings: Secondary | ICD-10-CM | POA: Diagnosis not present

## 2017-12-02 DIAGNOSIS — F329 Major depressive disorder, single episode, unspecified: Secondary | ICD-10-CM | POA: Diagnosis not present

## 2017-12-02 DIAGNOSIS — H9190 Unspecified hearing loss, unspecified ear: Secondary | ICD-10-CM | POA: Diagnosis not present

## 2017-12-02 DIAGNOSIS — I6990 Unspecified sequelae of unspecified cerebrovascular disease: Secondary | ICD-10-CM | POA: Diagnosis not present

## 2017-12-02 DIAGNOSIS — Z1389 Encounter for screening for other disorder: Secondary | ICD-10-CM | POA: Diagnosis not present

## 2017-12-02 DIAGNOSIS — E038 Other specified hypothyroidism: Secondary | ICD-10-CM | POA: Diagnosis not present

## 2017-12-02 DIAGNOSIS — K219 Gastro-esophageal reflux disease without esophagitis: Secondary | ICD-10-CM | POA: Diagnosis not present

## 2017-12-02 DIAGNOSIS — E785 Hyperlipidemia, unspecified: Secondary | ICD-10-CM | POA: Diagnosis not present

## 2017-12-02 DIAGNOSIS — F5105 Insomnia due to other mental disorder: Secondary | ICD-10-CM | POA: Diagnosis not present

## 2017-12-30 DIAGNOSIS — M160 Bilateral primary osteoarthritis of hip: Secondary | ICD-10-CM | POA: Diagnosis not present

## 2017-12-30 DIAGNOSIS — M1611 Unilateral primary osteoarthritis, right hip: Secondary | ICD-10-CM | POA: Diagnosis not present

## 2017-12-30 DIAGNOSIS — M25562 Pain in left knee: Secondary | ICD-10-CM | POA: Diagnosis not present

## 2017-12-30 DIAGNOSIS — M1612 Unilateral primary osteoarthritis, left hip: Secondary | ICD-10-CM | POA: Diagnosis not present

## 2018-01-04 ENCOUNTER — Other Ambulatory Visit: Payer: Self-pay | Admitting: Gastroenterology

## 2018-01-04 DIAGNOSIS — Z8673 Personal history of transient ischemic attack (TIA), and cerebral infarction without residual deficits: Secondary | ICD-10-CM | POA: Diagnosis not present

## 2018-01-04 DIAGNOSIS — K5900 Constipation, unspecified: Secondary | ICD-10-CM | POA: Diagnosis not present

## 2018-01-04 DIAGNOSIS — Z86010 Personal history of colonic polyps: Secondary | ICD-10-CM | POA: Diagnosis not present

## 2018-01-05 ENCOUNTER — Other Ambulatory Visit: Payer: Self-pay | Admitting: Family Medicine

## 2018-01-05 DIAGNOSIS — Z1231 Encounter for screening mammogram for malignant neoplasm of breast: Secondary | ICD-10-CM

## 2018-01-14 ENCOUNTER — Ambulatory Visit
Admission: RE | Admit: 2018-01-14 | Discharge: 2018-01-14 | Disposition: A | Discharge status: Home / Self Care | Payer: Medicare Other | Source: Ambulatory Visit | Attending: Family Medicine | Admitting: Family Medicine

## 2018-01-14 DIAGNOSIS — Z1231 Encounter for screening mammogram for malignant neoplasm of breast: Secondary | ICD-10-CM

## 2018-01-18 ENCOUNTER — Ambulatory Visit
Admission: RE | Admit: 2018-01-18 | Discharge: 2018-01-18 | Disposition: A | Discharge status: Home / Self Care | Payer: Medicare Other | Source: Ambulatory Visit | Attending: Gastroenterology | Admitting: Gastroenterology

## 2018-01-18 ENCOUNTER — Other Ambulatory Visit: Payer: Self-pay | Admitting: Gastroenterology

## 2018-01-18 DIAGNOSIS — Z860100 Personal history of colon polyps, unspecified: Secondary | ICD-10-CM

## 2018-01-27 ENCOUNTER — Other Ambulatory Visit: Payer: Medicare Other

## 2018-03-15 DIAGNOSIS — E038 Other specified hypothyroidism: Secondary | ICD-10-CM | POA: Diagnosis not present

## 2018-04-05 ENCOUNTER — Other Ambulatory Visit: Payer: Self-pay

## 2018-04-05 ENCOUNTER — Inpatient Hospital Stay (HOSPITAL_COMMUNITY)
Admission: EM | Admit: 2018-04-05 | Discharge: 2018-04-09 | DRG: 563 | Disposition: A | Discharge status: Skilled Nursing Facility | Payer: No Typology Code available for payment source | Attending: Internal Medicine | Admitting: Internal Medicine

## 2018-04-05 ENCOUNTER — Emergency Department (HOSPITAL_COMMUNITY): Payer: No Typology Code available for payment source

## 2018-04-05 ENCOUNTER — Encounter (HOSPITAL_COMMUNITY): Payer: Self-pay | Admitting: Pharmacy Technician

## 2018-04-05 DIAGNOSIS — R000 Tachycardia, unspecified: Secondary | ICD-10-CM | POA: Diagnosis not present

## 2018-04-05 DIAGNOSIS — M79671 Pain in right foot: Secondary | ICD-10-CM | POA: Diagnosis present

## 2018-04-05 DIAGNOSIS — M5489 Other dorsalgia: Secondary | ICD-10-CM | POA: Diagnosis present

## 2018-04-05 DIAGNOSIS — I10 Essential (primary) hypertension: Secondary | ICD-10-CM | POA: Diagnosis not present

## 2018-04-05 DIAGNOSIS — S82831A Other fracture of upper and lower end of right fibula, initial encounter for closed fracture: Secondary | ICD-10-CM | POA: Diagnosis not present

## 2018-04-05 DIAGNOSIS — S8992XA Unspecified injury of left lower leg, initial encounter: Secondary | ICD-10-CM | POA: Diagnosis not present

## 2018-04-05 DIAGNOSIS — M25562 Pain in left knee: Secondary | ICD-10-CM | POA: Diagnosis present

## 2018-04-05 DIAGNOSIS — E785 Hyperlipidemia, unspecified: Secondary | ICD-10-CM | POA: Diagnosis not present

## 2018-04-05 DIAGNOSIS — Z79899 Other long term (current) drug therapy: Secondary | ICD-10-CM

## 2018-04-05 DIAGNOSIS — S161XXA Strain of muscle, fascia and tendon at neck level, initial encounter: Secondary | ICD-10-CM

## 2018-04-05 DIAGNOSIS — S3991XA Unspecified injury of abdomen, initial encounter: Secondary | ICD-10-CM | POA: Diagnosis not present

## 2018-04-05 DIAGNOSIS — S60222A Contusion of left hand, initial encounter: Secondary | ICD-10-CM | POA: Diagnosis not present

## 2018-04-05 DIAGNOSIS — S299XXA Unspecified injury of thorax, initial encounter: Secondary | ICD-10-CM | POA: Diagnosis not present

## 2018-04-05 DIAGNOSIS — K769 Liver disease, unspecified: Secondary | ICD-10-CM | POA: Diagnosis present

## 2018-04-05 DIAGNOSIS — F418 Other specified anxiety disorders: Secondary | ICD-10-CM | POA: Diagnosis present

## 2018-04-05 DIAGNOSIS — M62838 Other muscle spasm: Secondary | ICD-10-CM | POA: Diagnosis present

## 2018-04-05 DIAGNOSIS — R109 Unspecified abdominal pain: Secondary | ICD-10-CM | POA: Diagnosis not present

## 2018-04-05 DIAGNOSIS — E559 Vitamin D deficiency, unspecified: Secondary | ICD-10-CM | POA: Diagnosis present

## 2018-04-05 DIAGNOSIS — S82401A Unspecified fracture of shaft of right fibula, initial encounter for closed fracture: Secondary | ICD-10-CM | POA: Diagnosis present

## 2018-04-05 DIAGNOSIS — Z289 Immunization not carried out for unspecified reason: Secondary | ICD-10-CM

## 2018-04-05 DIAGNOSIS — S99921A Unspecified injury of right foot, initial encounter: Secondary | ICD-10-CM | POA: Diagnosis not present

## 2018-04-05 DIAGNOSIS — S3011XA Contusion of abdominal wall, initial encounter: Secondary | ICD-10-CM

## 2018-04-05 DIAGNOSIS — Z7902 Long term (current) use of antithrombotics/antiplatelets: Secondary | ICD-10-CM

## 2018-04-05 DIAGNOSIS — E039 Hypothyroidism, unspecified: Secondary | ICD-10-CM | POA: Diagnosis not present

## 2018-04-05 DIAGNOSIS — Z803 Family history of malignant neoplasm of breast: Secondary | ICD-10-CM

## 2018-04-05 DIAGNOSIS — S79912A Unspecified injury of left hip, initial encounter: Secondary | ICD-10-CM | POA: Diagnosis not present

## 2018-04-05 DIAGNOSIS — S0990XA Unspecified injury of head, initial encounter: Secondary | ICD-10-CM | POA: Diagnosis not present

## 2018-04-05 DIAGNOSIS — M79642 Pain in left hand: Secondary | ICD-10-CM | POA: Diagnosis present

## 2018-04-05 DIAGNOSIS — S199XXA Unspecified injury of neck, initial encounter: Secondary | ICD-10-CM | POA: Diagnosis not present

## 2018-04-05 DIAGNOSIS — S301XXA Contusion of abdominal wall, initial encounter: Secondary | ICD-10-CM | POA: Diagnosis not present

## 2018-04-05 DIAGNOSIS — Z8673 Personal history of transient ischemic attack (TIA), and cerebral infarction without residual deficits: Secondary | ICD-10-CM

## 2018-04-05 DIAGNOSIS — Y92410 Unspecified street and highway as the place of occurrence of the external cause: Secondary | ICD-10-CM

## 2018-04-05 DIAGNOSIS — I639 Cerebral infarction, unspecified: Secondary | ICD-10-CM

## 2018-04-05 DIAGNOSIS — M25552 Pain in left hip: Secondary | ICD-10-CM | POA: Diagnosis not present

## 2018-04-05 DIAGNOSIS — M810 Age-related osteoporosis without current pathological fracture: Secondary | ICD-10-CM | POA: Diagnosis present

## 2018-04-05 LAB — CBC WITH DIFFERENTIAL/PLATELET
Abs Immature Granulocytes: 0.05 10*3/uL (ref 0.00–0.07)
BASOS ABS: 0 10*3/uL (ref 0.0–0.1)
Basophils Relative: 0 %
EOS ABS: 0.1 10*3/uL (ref 0.0–0.5)
EOS PCT: 1 %
HEMATOCRIT: 37.6 % (ref 36.0–46.0)
Hemoglobin: 12.4 g/dL (ref 12.0–15.0)
Immature Granulocytes: 1 %
LYMPHS ABS: 1.7 10*3/uL (ref 0.7–4.0)
Lymphocytes Relative: 15 %
MCH: 30.2 pg (ref 26.0–34.0)
MCHC: 33 g/dL (ref 30.0–36.0)
MCV: 91.7 fL (ref 80.0–100.0)
MONO ABS: 0.7 10*3/uL (ref 0.1–1.0)
Monocytes Relative: 6 %
Neutro Abs: 8.3 10*3/uL — ABNORMAL HIGH (ref 1.7–7.7)
Neutrophils Relative %: 77 %
Platelets: 237 10*3/uL (ref 150–400)
RBC: 4.1 MIL/uL (ref 3.87–5.11)
RDW: 12.2 % (ref 11.5–15.5)
WBC: 10.8 10*3/uL — AB (ref 4.0–10.5)
nRBC: 0 % (ref 0.0–0.2)

## 2018-04-05 LAB — I-STAT CREATININE, ED: Creatinine, Ser: 0.9 mg/dL (ref 0.44–1.00)

## 2018-04-05 LAB — COMPREHENSIVE METABOLIC PANEL
ALK PHOS: 47 U/L (ref 38–126)
ALT: 18 U/L (ref 0–44)
AST: 26 U/L (ref 15–41)
Albumin: 3.7 g/dL (ref 3.5–5.0)
Anion gap: 10 (ref 5–15)
BUN: 22 mg/dL (ref 8–23)
CALCIUM: 9.2 mg/dL (ref 8.9–10.3)
CO2: 21 mmol/L — ABNORMAL LOW (ref 22–32)
CREATININE: 1.05 mg/dL — AB (ref 0.44–1.00)
Chloride: 108 mmol/L (ref 98–111)
GFR calc Af Amer: >60 mL/min (ref >60)
GFR, EST NON AFRICAN AMERICAN: 52 mL/min — AB (ref >60)
Glucose, Bld: 143 mg/dL — ABNORMAL HIGH (ref 70–99)
Potassium: 4.5 mmol/L (ref 3.5–5.1)
Sodium: 139 mmol/L (ref 135–145)
Total Bilirubin: 0.8 mg/dL (ref 0.3–1.2)
Total Protein: 6.4 g/dL — ABNORMAL LOW (ref 6.5–8.1)

## 2018-04-05 MED ORDER — IOHEXOL 300 MG/ML  SOLN
100.0000 mL | Freq: Once | INTRAMUSCULAR | Status: AC | PRN
  Administered 2018-04-05: 100 mL via INTRAVENOUS

## 2018-04-05 MED ORDER — SODIUM CHLORIDE 0.9 % IV BOLUS
500.0000 mL | Freq: Once | INTRAVENOUS | Status: AC
  Administered 2018-04-05: 500 mL via INTRAVENOUS

## 2018-04-05 MED ORDER — TETANUS-DIPHTH-ACELL PERTUSSIS 5-2.5-18.5 LF-MCG/0.5 IM SUSP
0.5000 mL | Freq: Once | INTRAMUSCULAR | Status: AC
  Administered 2018-04-05: 0.5 mL via INTRAMUSCULAR
  Filled 2018-04-05: qty 0.5

## 2018-04-05 MED ORDER — IOHEXOL 300 MG/ML  SOLN
80.0000 mL | Freq: Once | INTRAMUSCULAR | Status: AC | PRN
  Administered 2018-04-05: 80 mL via INTRAVENOUS

## 2018-04-05 MED ORDER — FENTANYL CITRATE (PF) 100 MCG/2ML IJ SOLN
50.0000 ug | Freq: Once | INTRAMUSCULAR | Status: AC
  Administered 2018-04-05: 50 ug via INTRAVENOUS
  Filled 2018-04-05: qty 2

## 2018-04-05 NOTE — ED Notes (Signed)
Patient transported to X-ray 
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
## 2018-04-05 NOTE — Progress Notes (Signed)
Orthopedic Tech Progress Note Patient Details:  Emily Bartlett 07-11-1940 [id number]  Ortho Devices Type of Ortho Device: Knee Immobilizer Ortho Device/Splint Location: rle Ortho Device/Splint Interventions: Ordered, Application, Adjustment   Post Interventions Patient Tolerated: Well Instructions Provided: Care of device, Adjustment of device   Karolee Stamps 04/05/2018, 11:13 PM

## 2018-04-06 ENCOUNTER — Other Ambulatory Visit: Payer: Self-pay

## 2018-04-06 DIAGNOSIS — S82831A Other fracture of upper and lower end of right fibula, initial encounter for closed fracture: Secondary | ICD-10-CM | POA: Diagnosis present

## 2018-04-06 DIAGNOSIS — E785 Hyperlipidemia, unspecified: Secondary | ICD-10-CM | POA: Diagnosis present

## 2018-04-06 DIAGNOSIS — Y92410 Unspecified street and highway as the place of occurrence of the external cause: Secondary | ICD-10-CM | POA: Diagnosis not present

## 2018-04-06 DIAGNOSIS — M62838 Other muscle spasm: Secondary | ICD-10-CM | POA: Diagnosis present

## 2018-04-06 DIAGNOSIS — M79642 Pain in left hand: Secondary | ICD-10-CM | POA: Diagnosis present

## 2018-04-06 DIAGNOSIS — E039 Hypothyroidism, unspecified: Secondary | ICD-10-CM | POA: Diagnosis present

## 2018-04-06 DIAGNOSIS — R2689 Other abnormalities of gait and mobility: Secondary | ICD-10-CM | POA: Diagnosis not present

## 2018-04-06 DIAGNOSIS — S82831D Other fracture of upper and lower end of right fibula, subsequent encounter for closed fracture with routine healing: Secondary | ICD-10-CM | POA: Diagnosis not present

## 2018-04-06 DIAGNOSIS — E038 Other specified hypothyroidism: Secondary | ICD-10-CM | POA: Diagnosis not present

## 2018-04-06 DIAGNOSIS — S161XXA Strain of muscle, fascia and tendon at neck level, initial encounter: Secondary | ICD-10-CM | POA: Diagnosis present

## 2018-04-06 DIAGNOSIS — S301XXA Contusion of abdominal wall, initial encounter: Secondary | ICD-10-CM | POA: Diagnosis present

## 2018-04-06 DIAGNOSIS — Z289 Immunization not carried out for unspecified reason: Secondary | ICD-10-CM | POA: Diagnosis not present

## 2018-04-06 DIAGNOSIS — M6281 Muscle weakness (generalized): Secondary | ICD-10-CM | POA: Diagnosis not present

## 2018-04-06 DIAGNOSIS — E7849 Other hyperlipidemia: Secondary | ICD-10-CM | POA: Diagnosis not present

## 2018-04-06 DIAGNOSIS — F418 Other specified anxiety disorders: Secondary | ICD-10-CM | POA: Diagnosis present

## 2018-04-06 DIAGNOSIS — M25562 Pain in left knee: Secondary | ICD-10-CM | POA: Diagnosis present

## 2018-04-06 DIAGNOSIS — Z8673 Personal history of transient ischemic attack (TIA), and cerebral infarction without residual deficits: Secondary | ICD-10-CM | POA: Diagnosis not present

## 2018-04-06 DIAGNOSIS — M5489 Other dorsalgia: Secondary | ICD-10-CM | POA: Diagnosis present

## 2018-04-06 DIAGNOSIS — I10 Essential (primary) hypertension: Secondary | ICD-10-CM | POA: Diagnosis present

## 2018-04-06 DIAGNOSIS — F331 Major depressive disorder, recurrent, moderate: Secondary | ICD-10-CM | POA: Diagnosis not present

## 2018-04-06 DIAGNOSIS — Q446 Cystic disease of liver: Secondary | ICD-10-CM | POA: Diagnosis not present

## 2018-04-06 DIAGNOSIS — E559 Vitamin D deficiency, unspecified: Secondary | ICD-10-CM | POA: Diagnosis present

## 2018-04-06 DIAGNOSIS — F411 Generalized anxiety disorder: Secondary | ICD-10-CM | POA: Diagnosis not present

## 2018-04-06 DIAGNOSIS — M79671 Pain in right foot: Secondary | ICD-10-CM | POA: Diagnosis present

## 2018-04-06 DIAGNOSIS — M2550 Pain in unspecified joint: Secondary | ICD-10-CM | POA: Diagnosis not present

## 2018-04-06 DIAGNOSIS — T07XXXA Unspecified multiple injuries, initial encounter: Secondary | ICD-10-CM | POA: Diagnosis not present

## 2018-04-06 DIAGNOSIS — Z79899 Other long term (current) drug therapy: Secondary | ICD-10-CM | POA: Diagnosis not present

## 2018-04-06 DIAGNOSIS — J8410 Pulmonary fibrosis, unspecified: Secondary | ICD-10-CM | POA: Diagnosis not present

## 2018-04-06 DIAGNOSIS — I639 Cerebral infarction, unspecified: Secondary | ICD-10-CM | POA: Diagnosis not present

## 2018-04-06 DIAGNOSIS — Z7401 Bed confinement status: Secondary | ICD-10-CM | POA: Diagnosis not present

## 2018-04-06 DIAGNOSIS — M810 Age-related osteoporosis without current pathological fracture: Secondary | ICD-10-CM | POA: Diagnosis present

## 2018-04-06 DIAGNOSIS — Z7902 Long term (current) use of antithrombotics/antiplatelets: Secondary | ICD-10-CM | POA: Diagnosis not present

## 2018-04-06 DIAGNOSIS — K769 Liver disease, unspecified: Secondary | ICD-10-CM | POA: Diagnosis present

## 2018-04-06 DIAGNOSIS — Z803 Family history of malignant neoplasm of breast: Secondary | ICD-10-CM | POA: Diagnosis not present

## 2018-04-06 LAB — CBC
HCT: 36.1 % (ref 36.0–46.0)
Hemoglobin: 12.1 g/dL (ref 12.0–15.0)
MCH: 30 pg (ref 26.0–34.0)
MCHC: 33.5 g/dL (ref 30.0–36.0)
MCV: 89.4 fL (ref 80.0–100.0)
Platelets: 236 10*3/uL (ref 150–400)
RBC: 4.04 MIL/uL (ref 3.87–5.11)
RDW: 12.3 % (ref 11.5–15.5)
WBC: 8.7 10*3/uL (ref 4.0–10.5)
nRBC: 0 % (ref 0.0–0.2)

## 2018-04-06 LAB — BASIC METABOLIC PANEL
ANION GAP: 11 (ref 5–15)
BUN: 17 mg/dL (ref 8–23)
CO2: 23 mmol/L (ref 22–32)
Calcium: 9.2 mg/dL (ref 8.9–10.3)
Chloride: 107 mmol/L (ref 98–111)
Creatinine, Ser: 1.12 mg/dL — ABNORMAL HIGH (ref 0.44–1.00)
GFR calc non Af Amer: 48 mL/min — ABNORMAL LOW (ref >60)
GFR, EST AFRICAN AMERICAN: 56 mL/min — AB (ref >60)
Glucose, Bld: 132 mg/dL — ABNORMAL HIGH (ref 70–99)
Potassium: 4.5 mmol/L (ref 3.5–5.1)
Sodium: 141 mmol/L (ref 135–145)

## 2018-04-06 MED ORDER — METHOCARBAMOL 500 MG PO TABS
500.0000 mg | ORAL_TABLET | Freq: Three times a day (TID) | ORAL | Status: DC | PRN
  Administered 2018-04-07 – 2018-04-09 (×3): 500 mg via ORAL
  Filled 2018-04-06 (×4): qty 1

## 2018-04-06 MED ORDER — OXYCODONE-ACETAMINOPHEN 5-325 MG PO TABS
1.0000 | ORAL_TABLET | ORAL | Status: DC | PRN
  Administered 2018-04-06 – 2018-04-09 (×6): 1 via ORAL
  Filled 2018-04-06 (×7): qty 1

## 2018-04-06 MED ORDER — ONDANSETRON HCL 4 MG/2ML IJ SOLN: 4.0000 mg | Freq: Three times a day (TID) | INTRAMUSCULAR | Status: DC | PRN

## 2018-04-06 MED ORDER — MELOXICAM 7.5 MG PO TABS
7.5000 mg | ORAL_TABLET | Freq: Every day | ORAL | Status: DC
  Administered 2018-04-06 – 2018-04-09 (×4): 7.5 mg via ORAL
  Filled 2018-04-06 (×4): qty 1

## 2018-04-06 MED ORDER — SENNOSIDES-DOCUSATE SODIUM 8.6-50 MG PO TABS
1.0000 | ORAL_TABLET | Freq: Every evening | ORAL | Status: DC | PRN
  Administered 2018-04-09: 1 via ORAL
  Filled 2018-04-06: qty 1

## 2018-04-06 MED ORDER — ENOXAPARIN SODIUM 40 MG/0.4ML ~~LOC~~ SOLN
40.0000 mg | Freq: Every day | SUBCUTANEOUS | Status: DC
  Administered 2018-04-06 – 2018-04-09 (×4): 40 mg via SUBCUTANEOUS
  Filled 2018-04-06 (×4): qty 0.4

## 2018-04-06 MED ORDER — ACETAMINOPHEN 325 MG PO TABS
650.0000 mg | ORAL_TABLET | Freq: Four times a day (QID) | ORAL | Status: DC | PRN
  Administered 2018-04-06: 650 mg via ORAL
  Filled 2018-04-06 (×2): qty 2

## 2018-04-06 MED ORDER — PRAVASTATIN SODIUM 10 MG PO TABS
20.0000 mg | ORAL_TABLET | Freq: Every day | ORAL | Status: DC
  Administered 2018-04-06 – 2018-04-09 (×4): 20 mg via ORAL
  Filled 2018-04-06 (×5): qty 2

## 2018-04-06 MED ORDER — CLOPIDOGREL BISULFATE 75 MG PO TABS
75.0000 mg | ORAL_TABLET | Freq: Every day | ORAL | Status: DC
  Administered 2018-04-06 – 2018-04-09 (×4): 75 mg via ORAL
  Filled 2018-04-06 (×4): qty 1

## 2018-04-06 MED ORDER — LOSARTAN POTASSIUM 50 MG PO TABS
50.0000 mg | ORAL_TABLET | Freq: Every day | ORAL | Status: DC
  Administered 2018-04-07 – 2018-04-09 (×3): 50 mg via ORAL
  Filled 2018-04-06 (×3): qty 1

## 2018-04-06 MED ORDER — MORPHINE SULFATE (PF) 2 MG/ML IV SOLN
0.5000 mg | INTRAVENOUS | Status: DC | PRN
  Administered 2018-04-06: 0.5 mg via INTRAVENOUS
  Filled 2018-04-06: qty 1

## 2018-04-06 MED ORDER — LOSARTAN POTASSIUM 50 MG PO TABS
100.0000 mg | ORAL_TABLET | Freq: Every day | ORAL | Status: DC
  Administered 2018-04-06: 100 mg via ORAL
  Filled 2018-04-06 (×2): qty 2

## 2018-04-06 MED ORDER — LEVOTHYROXINE SODIUM 100 MCG PO TABS
100.0000 ug | ORAL_TABLET | Freq: Every day | ORAL | Status: DC
  Administered 2018-04-06 – 2018-04-09 (×4): 100 ug via ORAL
  Filled 2018-04-06 (×4): qty 1

## 2018-04-06 MED ORDER — ZOLPIDEM TARTRATE 5 MG PO TABS
5.0000 mg | ORAL_TABLET | Freq: Every evening | ORAL | Status: DC | PRN
  Administered 2018-04-06 – 2018-04-08 (×4): 5 mg via ORAL
  Filled 2018-04-06 (×4): qty 1

## 2018-04-06 MED ORDER — HYDRALAZINE HCL 20 MG/ML IJ SOLN: 5.0000 mg | INTRAMUSCULAR | Status: DC | PRN

## 2018-04-06 NOTE — Plan of Care (Signed)
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
## 2018-04-06 NOTE — Plan of Care (Signed)
  Problem: Pain Managment: Goal: General experience of comfort will improve Outcome: Progressing   Problem: Safety: Goal: Ability to remain free from injury will improve Outcome: Progressing   
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
## 2018-04-07 LAB — BASIC METABOLIC PANEL
Anion gap: 10 (ref 5–15)
BUN: 19 mg/dL (ref 8–23)
CO2: 24 mmol/L (ref 22–32)
Calcium: 9.2 mg/dL (ref 8.9–10.3)
Chloride: 107 mmol/L (ref 98–111)
Creatinine, Ser: 0.99 mg/dL (ref 0.44–1.00)
GFR calc Af Amer: >60 mL/min (ref >60)
GFR, EST NON AFRICAN AMERICAN: 56 mL/min — AB (ref >60)
Glucose, Bld: 140 mg/dL — ABNORMAL HIGH (ref 70–99)
POTASSIUM: 4.2 mmol/L (ref 3.5–5.1)
Sodium: 141 mmol/L (ref 135–145)

## 2018-04-07 MED ORDER — POLYETHYLENE GLYCOL 3350 17 G PO PACK
17.0000 g | PACK | Freq: Every day | ORAL | Status: DC
  Administered 2018-04-07 – 2018-04-09 (×3): 17 g via ORAL
  Filled 2018-04-07 (×3): qty 1
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
## 2018-04-07 NOTE — Clinical Social Work Note (Signed)
Clinical Social Work Assessment  Patient Details  Name: Emily Bartlett MRN: [id number] Date of Birth: 1940/07/05  Date of referral:  04/07/18               Reason for consult:  Discharge Planning                Permission sought to share information with:  Case Manager, Facility Sport and exercise psychologist, Family Supports Permission granted to share information::  Yes, Verbal Permission Granted  Name::     Retail buyer::  SNFs  Relationship::  daughter  Contact Information:  [phone]  Housing/Transportation Living arrangements for the past 2 months:  [place] of Information:  Patient Patient Interpreter Needed:  None Criminal Activity/Legal Involvement Pertinent to Current Situation/Hospitalization:  No - Comment as needed Significant Relationships:  Adult Children Lives with:  Self Do you feel safe going back to the place where you live?  No Need for family participation in patient care:  Yes (Comment)  Care giving concerns:  CSW received referral for possible SNF placement at time of discharge. Spoke with patient regarding possibility of SNF placement . Patient's family   is currently unable to care for her at their home given patient's current needs and fall risk.  Patient   expressed understanding of PT recommendation and are agreeable to SNF placement at time of discharge. CSW to continue to follow and assist with discharge planning needs.     Social Worker assessment / plan:  Spoke with patient concerning possibility of rehab at SNF before returning home.    Employment status:  Retired Forensic scientist:  Medicare PT Recommendations:  [place] / Referral to community resources:  [place]  Patient/Family's Response to care:  Patient    recognizes need for rehab before returning home and are agreeable to a SNF in [place]. They report preference for  [facility] or [facility]     . CSW explained  insurance authorization process. Patient's family reported that they want patient to get stronger to be able to come back home.    Patient/Family's Understanding of and Emotional Response to Diagnosis, Current Treatment, and Prognosis:  Patient/family is realistic regarding therapy needs and expressed being hopeful for SNF placement. Patient expressed understanding of CSW role and discharge process as well as medical condition. No questions/concerns about plan or treatment.    Emotional Assessment Appearance:  Appears stated age Attitude/Demeanor/Rapport:  Gracious Affect (typically observed):  Accepting, Adaptable Orientation:  Oriented to Self, Oriented to Place, Oriented to  Time, Oriented to Situation Alcohol / Substance use:  Not Applicable Psych involvement (Current and /or in the community):  No (Comment)  Discharge Needs  Concerns to be addressed:  Discharge Planning Concerns Readmission within the last 30 days:  No Current discharge risk:  Dependent with Mobility Barriers to Discharge:  Continued Medical Work up   [employer], [place] 04/07/2018, 2:39 PM

## 2018-04-07 NOTE — Plan of Care (Signed)
Problem: Education: Goal: Knowledge of General Education information will improve Description Including pain rating scale, medication(s)/side effects and non-pharmacologic comfort measures Outcome: Progressing   Problem: Health Behavior/Discharge Planning: Goal: Ability to manage health-related needs will improve Outcome: Progressing   Problem: Clinical Measurements: Goal: Respiratory complications will improve Outcome: Progressing   Problem: Activity: Goal: Risk for activity intolerance will decrease Outcome: Progressing   Problem: Nutrition: Goal: Adequate nutrition will be maintained Outcome: Progressing   Problem: Coping: Goal: Level of anxiety will decrease Outcome: Progressing   Problem: Pain Managment: Goal: General experience of comfort will improve Outcome: Progressing   Problem: Safety: Goal: Ability to remain free from injury will improve Outcome: Progressing   Problem: Skin Integrity: Goal: Risk for impaired skin integrity will decrease Outcome: Progressing
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
## 2018-04-08 ENCOUNTER — Encounter (HOSPITAL_COMMUNITY): Payer: Self-pay
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
## 2018-04-08 NOTE — Progress Notes (Signed)
Patient will dc to [facility] tomorrow 04/09/2018 after 3 night inpatient stay.   [place], [place]
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
## 2018-04-09 DIAGNOSIS — M2550 Pain in unspecified joint: Secondary | ICD-10-CM | POA: Diagnosis not present

## 2018-04-09 DIAGNOSIS — R2689 Other abnormalities of gait and mobility: Secondary | ICD-10-CM | POA: Diagnosis not present

## 2018-04-09 DIAGNOSIS — E039 Hypothyroidism, unspecified: Secondary | ICD-10-CM | POA: Diagnosis not present

## 2018-04-09 DIAGNOSIS — I639 Cerebral infarction, unspecified: Secondary | ICD-10-CM | POA: Diagnosis not present

## 2018-04-09 DIAGNOSIS — T07XXXA Unspecified multiple injuries, initial encounter: Secondary | ICD-10-CM | POA: Diagnosis not present

## 2018-04-09 DIAGNOSIS — E785 Hyperlipidemia, unspecified: Secondary | ICD-10-CM | POA: Diagnosis not present

## 2018-04-09 DIAGNOSIS — E7849 Other hyperlipidemia: Secondary | ICD-10-CM | POA: Diagnosis not present

## 2018-04-09 DIAGNOSIS — M6281 Muscle weakness (generalized): Secondary | ICD-10-CM | POA: Diagnosis not present

## 2018-04-09 DIAGNOSIS — S161XXA Strain of muscle, fascia and tendon at neck level, initial encounter: Secondary | ICD-10-CM | POA: Diagnosis not present

## 2018-04-09 DIAGNOSIS — M810 Age-related osteoporosis without current pathological fracture: Secondary | ICD-10-CM | POA: Diagnosis not present

## 2018-04-09 DIAGNOSIS — S82831A Other fracture of upper and lower end of right fibula, initial encounter for closed fracture: Secondary | ICD-10-CM | POA: Diagnosis not present

## 2018-04-09 DIAGNOSIS — S82831D Other fracture of upper and lower end of right fibula, subsequent encounter for closed fracture with routine healing: Secondary | ICD-10-CM | POA: Diagnosis not present

## 2018-04-09 DIAGNOSIS — E038 Other specified hypothyroidism: Secondary | ICD-10-CM | POA: Diagnosis not present

## 2018-04-09 DIAGNOSIS — Z7401 Bed confinement status: Secondary | ICD-10-CM | POA: Diagnosis not present

## 2018-04-09 DIAGNOSIS — I10 Essential (primary) hypertension: Secondary | ICD-10-CM | POA: Diagnosis not present

## 2018-04-09 DIAGNOSIS — F411 Generalized anxiety disorder: Secondary | ICD-10-CM | POA: Diagnosis not present

## 2018-04-09 DIAGNOSIS — Z8673 Personal history of transient ischemic attack (TIA), and cerebral infarction without residual deficits: Secondary | ICD-10-CM | POA: Diagnosis not present

## 2018-04-09 DIAGNOSIS — Q446 Cystic disease of liver: Secondary | ICD-10-CM | POA: Diagnosis not present

## 2018-04-09 DIAGNOSIS — F331 Major depressive disorder, recurrent, moderate: Secondary | ICD-10-CM | POA: Diagnosis not present

## 2018-04-09 DIAGNOSIS — J8410 Pulmonary fibrosis, unspecified: Secondary | ICD-10-CM | POA: Diagnosis not present

## 2018-04-09 MED ORDER — OXYCODONE-ACETAMINOPHEN 5-325 MG PO TABS: 1.0000 | ORAL_TABLET | ORAL | 0 refills | Status: DC | PRN

## 2018-04-09 MED ORDER — POLYETHYLENE GLYCOL 3350 17 G PO PACK: 17.0000 g | PACK | Freq: Every day | ORAL | 0 refills | Status: DC | PRN

## 2018-04-09 MED ORDER — METHOCARBAMOL 500 MG PO TABS: 500.0000 mg | ORAL_TABLET | Freq: Three times a day (TID) | ORAL | 0 refills | Status: DC | PRN

## 2018-04-09 MED ORDER — MELOXICAM 7.5 MG PO TABS: 7.5000 mg | ORAL_TABLET | Freq: Every day | ORAL | Status: DC

## 2018-04-09 MED ORDER — BISACODYL 10 MG RE SUPP: 10.0000 mg | Freq: Every day | RECTAL | Status: DC | PRN

## 2018-04-09 MED ORDER — PRAVASTATIN SODIUM 20 MG PO TABS: 20.0000 mg | ORAL_TABLET | Freq: Every day | ORAL | Status: DC

## 2018-04-09 MED ORDER — MAGNESIUM CITRATE PO SOLN
1.0000 | Freq: Once | ORAL | Status: AC
  Administered 2018-04-09: 1 via ORAL
  Filled 2018-04-09: qty 296

## 2018-04-09 MED ORDER — LOSARTAN POTASSIUM 100 MG PO TABS: 50.0000 mg | ORAL_TABLET | Freq: Every day | ORAL | Status: AC

## 2018-04-09 MED ORDER — LEVOTHYROXINE SODIUM 100 MCG PO TABS: 100.0000 ug | ORAL_TABLET | Freq: Every day | ORAL | Status: AC

## 2018-04-09 MED ORDER — CLOPIDOGREL BISULFATE 75 MG PO TABS: 75.0000 mg | ORAL_TABLET | Freq: Every day | ORAL | Status: AC

## 2018-04-09 MED ORDER — ZOLPIDEM TARTRATE 10 MG PO TABS: 10.0000 mg | ORAL_TABLET | Freq: Every evening | ORAL | 0 refills | Status: DC | PRN

## 2018-04-09 NOTE — Progress Notes (Signed)
Patient will DC to: [facility] date: 04/09/2018 Family notified: [name] Transport by: Corey Harold  Per MD patient ready for DC to [facility] . RN, patient, patient's family, and facility notified of DC. Discharge Summary sent to facility. RN given number for report [phone] Room2. DC packet on chart. Ambulance transport requested for patient.  CSW signing off.  [place], [place]

## 2018-04-09 NOTE — Progress Notes (Signed)
Rn called Emily Bartlett and gave [name] pt aware and awaiting PTAR. She comfortable

## 2018-04-09 NOTE — Clinical Social Work Placement (Signed)
   CLINICAL SOCIAL WORK PLACEMENT  NOTE  Date:  04/09/2018  Patient Details  Name: Emily Bartlett MRN: [id number] Date of Birth: 03/24/1940  Clinical Social Work is seeking post-discharge placement for this patient at the [place] level of care (*CSW will initial, date and re-position this form in  chart as items are completed):      Patient/family provided with [place] Work Department's list of facilities offering this level of care within the geographic area requested by the patient (or if unable, by the patient's family).  Yes   Patient/family informed of their freedom to choose among providers that offer the needed level of care, that participate in Medicare, Medicaid or managed care program needed by the patient, have an available bed and are willing to accept the patient.      Patient/family informed of [facility]'s ownership interest in [facility] and [facility], as well as of the fact that they are under no obligation to receive care at these facilities.  PASRR submitted to EDS on       PASRR number received on 04/07/18     Existing PASRR number confirmed on       FL2 transmitted to all facilities in geographic area requested by pt/family on 04/07/18     FL2 transmitted to all facilities within larger geographic area on       Patient informed that his/her managed care company has contracts with or will negotiate with certain facilities, including the following:        Yes   Patient/family informed of bed offers received.  Patient chooses bed at [facility]     Physician recommends and patient chooses bed at      Patient to be transferred to [facility] on 04/09/18.  Patient to be transferred to facility by PTAR     Patient family notified on 04/09/18 of transfer.  Name of family member notified:  Heather     PHYSICIAN       Additional Comment:     _______________________________________________ Alberteen Sam, LCSW 04/09/2018, 10:50 AM

## 2018-04-09 NOTE — Plan of Care (Signed)
  Problem: Clinical Measurements: Goal: Ability to maintain clinical measurements within normal limits will improve Outcome: Progressing   Problem: Pain Managment: Goal: General experience of comfort will improve Outcome: Progressing   

## 2018-04-27 DIAGNOSIS — J8410 Pulmonary fibrosis, unspecified: Secondary | ICD-10-CM | POA: Diagnosis not present

## 2018-04-27 DIAGNOSIS — E785 Hyperlipidemia, unspecified: Secondary | ICD-10-CM | POA: Diagnosis not present

## 2018-04-27 DIAGNOSIS — F418 Other specified anxiety disorders: Secondary | ICD-10-CM | POA: Diagnosis not present

## 2018-04-27 DIAGNOSIS — Q445 Other congenital malformations of bile ducts: Secondary | ICD-10-CM | POA: Diagnosis not present

## 2018-04-27 DIAGNOSIS — G4700 Insomnia, unspecified: Secondary | ICD-10-CM | POA: Diagnosis not present

## 2018-04-27 DIAGNOSIS — E038 Other specified hypothyroidism: Secondary | ICD-10-CM | POA: Diagnosis not present

## 2018-04-27 DIAGNOSIS — E669 Obesity, unspecified: Secondary | ICD-10-CM | POA: Diagnosis not present

## 2018-04-27 DIAGNOSIS — Z8673 Personal history of transient ischemic attack (TIA), and cerebral infarction without residual deficits: Secondary | ICD-10-CM | POA: Diagnosis not present

## 2018-04-27 DIAGNOSIS — I10 Essential (primary) hypertension: Secondary | ICD-10-CM | POA: Diagnosis not present

## 2018-04-27 DIAGNOSIS — S82831D Other fracture of upper and lower end of right fibula, subsequent encounter for closed fracture with routine healing: Secondary | ICD-10-CM | POA: Diagnosis not present

## 2018-04-27 DIAGNOSIS — M810 Age-related osteoporosis without current pathological fracture: Secondary | ICD-10-CM | POA: Diagnosis not present

## 2018-04-28 DIAGNOSIS — S82831D Other fracture of upper and lower end of right fibula, subsequent encounter for closed fracture with routine healing: Secondary | ICD-10-CM | POA: Diagnosis not present

## 2018-04-28 DIAGNOSIS — M1612 Unilateral primary osteoarthritis, left hip: Secondary | ICD-10-CM | POA: Diagnosis not present

## 2018-04-28 DIAGNOSIS — M25561 Pain in right knee: Secondary | ICD-10-CM | POA: Diagnosis not present

## 2018-04-29 ENCOUNTER — Other Ambulatory Visit: Payer: Self-pay | Admitting: *Deleted

## 2018-04-29 DIAGNOSIS — E038 Other specified hypothyroidism: Secondary | ICD-10-CM | POA: Diagnosis not present

## 2018-04-29 DIAGNOSIS — E785 Hyperlipidemia, unspecified: Secondary | ICD-10-CM | POA: Diagnosis not present

## 2018-04-29 DIAGNOSIS — S82831D Other fracture of upper and lower end of right fibula, subsequent encounter for closed fracture with routine healing: Secondary | ICD-10-CM | POA: Diagnosis not present

## 2018-04-29 DIAGNOSIS — M1711 Unilateral primary osteoarthritis, right knee: Secondary | ICD-10-CM | POA: Diagnosis not present

## 2018-04-29 DIAGNOSIS — M1612 Unilateral primary osteoarthritis, left hip: Secondary | ICD-10-CM | POA: Diagnosis not present

## 2018-04-29 DIAGNOSIS — I10 Essential (primary) hypertension: Secondary | ICD-10-CM | POA: Diagnosis not present

## 2018-04-29 DIAGNOSIS — F418 Other specified anxiety disorders: Secondary | ICD-10-CM | POA: Diagnosis not present

## 2018-04-29 DIAGNOSIS — M810 Age-related osteoporosis without current pathological fracture: Secondary | ICD-10-CM | POA: Diagnosis not present
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
## 2018-04-30 ENCOUNTER — Other Ambulatory Visit: Payer: Self-pay | Admitting: *Deleted
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
## 2018-05-03 ENCOUNTER — Other Ambulatory Visit: Payer: Self-pay | Admitting: *Deleted

## 2018-05-03 ENCOUNTER — Ambulatory Visit: Payer: Self-pay | Admitting: Orthopedic Surgery

## 2018-05-03 DIAGNOSIS — S82831D Other fracture of upper and lower end of right fibula, subsequent encounter for closed fracture with routine healing: Secondary | ICD-10-CM | POA: Diagnosis not present

## 2018-05-03 DIAGNOSIS — M810 Age-related osteoporosis without current pathological fracture: Secondary | ICD-10-CM | POA: Diagnosis not present

## 2018-05-03 DIAGNOSIS — I10 Essential (primary) hypertension: Secondary | ICD-10-CM | POA: Diagnosis not present

## 2018-05-03 DIAGNOSIS — E785 Hyperlipidemia, unspecified: Secondary | ICD-10-CM | POA: Diagnosis not present

## 2018-05-03 DIAGNOSIS — F418 Other specified anxiety disorders: Secondary | ICD-10-CM | POA: Diagnosis not present

## 2018-05-03 DIAGNOSIS — E038 Other specified hypothyroidism: Secondary | ICD-10-CM | POA: Diagnosis not present
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
## 2018-05-04 DIAGNOSIS — M1612 Unilateral primary osteoarthritis, left hip: Secondary | ICD-10-CM | POA: Diagnosis not present

## 2018-05-06 DIAGNOSIS — F418 Other specified anxiety disorders: Secondary | ICD-10-CM | POA: Diagnosis not present

## 2018-05-06 DIAGNOSIS — E785 Hyperlipidemia, unspecified: Secondary | ICD-10-CM | POA: Diagnosis not present

## 2018-05-06 DIAGNOSIS — E038 Other specified hypothyroidism: Secondary | ICD-10-CM | POA: Diagnosis not present

## 2018-05-06 DIAGNOSIS — S82831D Other fracture of upper and lower end of right fibula, subsequent encounter for closed fracture with routine healing: Secondary | ICD-10-CM | POA: Diagnosis not present

## 2018-05-06 DIAGNOSIS — I10 Essential (primary) hypertension: Secondary | ICD-10-CM | POA: Diagnosis not present

## 2018-05-06 DIAGNOSIS — M810 Age-related osteoporosis without current pathological fracture: Secondary | ICD-10-CM | POA: Diagnosis not present

## 2018-05-10 DIAGNOSIS — M810 Age-related osteoporosis without current pathological fracture: Secondary | ICD-10-CM | POA: Diagnosis not present

## 2018-05-10 DIAGNOSIS — I10 Essential (primary) hypertension: Secondary | ICD-10-CM | POA: Diagnosis not present

## 2018-05-10 DIAGNOSIS — F418 Other specified anxiety disorders: Secondary | ICD-10-CM | POA: Diagnosis not present

## 2018-05-10 DIAGNOSIS — E038 Other specified hypothyroidism: Secondary | ICD-10-CM | POA: Diagnosis not present

## 2018-05-10 DIAGNOSIS — S82831D Other fracture of upper and lower end of right fibula, subsequent encounter for closed fracture with routine healing: Secondary | ICD-10-CM | POA: Diagnosis not present

## 2018-05-10 DIAGNOSIS — E785 Hyperlipidemia, unspecified: Secondary | ICD-10-CM | POA: Diagnosis not present

## 2018-05-13 DIAGNOSIS — F418 Other specified anxiety disorders: Secondary | ICD-10-CM | POA: Diagnosis not present

## 2018-05-13 DIAGNOSIS — S82831D Other fracture of upper and lower end of right fibula, subsequent encounter for closed fracture with routine healing: Secondary | ICD-10-CM | POA: Diagnosis not present

## 2018-05-13 DIAGNOSIS — M810 Age-related osteoporosis without current pathological fracture: Secondary | ICD-10-CM | POA: Diagnosis not present

## 2018-05-13 DIAGNOSIS — I10 Essential (primary) hypertension: Secondary | ICD-10-CM | POA: Diagnosis not present

## 2018-05-13 DIAGNOSIS — E038 Other specified hypothyroidism: Secondary | ICD-10-CM | POA: Diagnosis not present

## 2018-05-13 DIAGNOSIS — E785 Hyperlipidemia, unspecified: Secondary | ICD-10-CM | POA: Diagnosis not present

## 2018-05-18 ENCOUNTER — Other Ambulatory Visit: Payer: Self-pay

## 2018-05-18 ENCOUNTER — Encounter
Admission: RE | Admit: 2018-05-18 | Discharge: 2018-05-18 | Disposition: A | Discharge status: Home / Self Care | Payer: Medicare Other | Source: Ambulatory Visit | Attending: Orthopedic Surgery | Admitting: Orthopedic Surgery

## 2018-05-18 DIAGNOSIS — Z01812 Encounter for preprocedural laboratory examination: Secondary | ICD-10-CM | POA: Insufficient documentation

## 2018-05-18 HISTORY — DX: Cerebral infarction, unspecified: I63.9

## 2018-05-18 LAB — URINALYSIS, ROUTINE W REFLEX MICROSCOPIC
Bacteria, UA: NONE SEEN
Bilirubin Urine: NEGATIVE
Glucose, UA: NEGATIVE mg/dL
Hgb urine dipstick: NEGATIVE
Ketones, ur: NEGATIVE mg/dL
Nitrite: NEGATIVE
Protein, ur: NEGATIVE mg/dL
Specific Gravity, Urine: 1.011 (ref 1.005–1.030)
Squamous Epithelial / HPF: NONE SEEN (ref 0–5)
pH: 7 (ref 5.0–8.0)

## 2018-05-18 LAB — BASIC METABOLIC PANEL
Anion gap: 10 (ref 5–15)
BUN: 23 mg/dL (ref 8–23)
CO2: 26 mmol/L (ref 22–32)
Calcium: 9.6 mg/dL (ref 8.9–10.3)
Chloride: 103 mmol/L (ref 98–111)
Creatinine, Ser: 0.87 mg/dL (ref 0.44–1.00)
GFR calc Af Amer: >60 mL/min (ref >60)
GFR calc non Af Amer: >60 mL/min (ref >60)
Glucose, Bld: 102 mg/dL — ABNORMAL HIGH (ref 70–99)
Potassium: 4.1 mmol/L (ref 3.5–5.1)
SODIUM: 139 mmol/L (ref 135–145)

## 2018-05-18 LAB — SURGICAL PCR SCREEN
MRSA, PCR: NEGATIVE
Staphylococcus aureus: NEGATIVE

## 2018-05-18 LAB — CBC
HCT: 42.8 % (ref 36.0–46.0)
Hemoglobin: 13.9 g/dL (ref 12.0–15.0)
MCH: 30.3 pg (ref 26.0–34.0)
MCHC: 32.5 g/dL (ref 30.0–36.0)
MCV: 93.4 fL (ref 80.0–100.0)
Platelets: 307 10*3/uL (ref 150–400)
RBC: 4.58 MIL/uL (ref 3.87–5.11)
RDW: 13.6 % (ref 11.5–15.5)
WBC: 11.9 10*3/uL — ABNORMAL HIGH (ref 4.0–10.5)
nRBC: 0 % (ref 0.0–0.2)

## 2018-05-18 LAB — PROTIME-INR
INR: 0.9 (ref 0.8–1.2)
PROTHROMBIN TIME: 12 s (ref 11.4–15.2)

## 2018-05-18 LAB — APTT: APTT: 36 s (ref 24–36)

## 2018-05-18 NOTE — Patient Instructions (Signed)
Your procedure is scheduled on: Tuesday 06/01/18.   Report to DAY SURGERY DEPARTMENT LOCATED ON 2ND FLOOR MEDICAL MALL ENTRANCE. To find out your arrival time please call [phone] between 1PM - 3PM on 05/30/28.    Remember: Instructions that are not followed completely may result in serious medical risk, up to and including death, or upon the discretion of your surgeon and anesthesiologist your surgery may need to be rescheduled.      _X__ 1. Do not eat food after midnight the night before your procedure.                 No gum chewing or hard candies. You may drink clear liquids up to 2 hours                 before you are scheduled to arrive for your surgery- DO NOT drink clear                 liquids within 2 hours of the start of your surgery.                 Clear Liquids include:  water, apple juice without pulp, clear carbohydrate                 drink such as Clearfast or Gatorade, Black Coffee or Tea (Do not add                 milk or creamer to coffee or tea).    __X__2.  On the morning of surgery brush your teeth with toothpaste and water, you may rinse your mouth with mouthwash if you wish.  Do not swallow any toothpaste or mouthwash.      __X__6.  Notify your doctor if there is any change in your medical condition      (cold, fever, infections).       Do not wear jewelry, make-up, hairpins, clips or nail polish. Do not wear lotions, powders, or perfumes.  Do not shave 48 hours prior to surgery. Men may shave face and neck. Do not bring valuables to the hospital.      [facility] is not responsible for any belongings or valuables.    Contacts, dentures/partials or body piercings may not be worn into surgery. Bring a case for your contacts, glasses or hearing aids, a denture cup will be supplied.    Leave your suitcase in the car. After surgery it may be brought to your room.    For patients admitted to the hospital, discharge time is determined by  your treatment team.      Please read over the following fact sheets that you were given:   MRSA Information    __X__ Take these medicines the morning of surgery with A SIP OF WATER:     1. levothyroxine (SYNTHROID, LEVOTHROID) 100 MCG tablet  2. pravastatin (PRAVACHOL) 20 MG tablet     __X__ Use CHG Soap as directed    __X__ Stop Blood Thinners: Plavix. Call Dr. Kelton Pillar to ask about when it is safe to stop taking Plavix before your procedure.    __X__ Stop Anti-inflammatories 7 days before surgery such as Advil, Ibuprofen, Motrin, BC or Goodies Powder, Naprosyn, Naproxen, Aleve, Aspirin, Meloxicam. May take Tylenol if needed for pain or discomfort.     __X__ Stop the following herbal supplements on Tuesday 05/25/18:    Colloidal Deepak Bless Dietary Supplement  ELDERBERRY

## 2018-05-21 ENCOUNTER — Ambulatory Visit: Payer: Self-pay | Admitting: Family Medicine

## 2018-05-25 DIAGNOSIS — R2689 Other abnormalities of gait and mobility: Secondary | ICD-10-CM | POA: Diagnosis not present

## 2018-05-25 DIAGNOSIS — M84463D Pathological fracture, right fibula, subsequent encounter for fracture with routine healing: Secondary | ICD-10-CM | POA: Diagnosis not present

## 2018-05-25 DIAGNOSIS — R2681 Unsteadiness on feet: Secondary | ICD-10-CM | POA: Diagnosis not present

## 2018-05-25 DIAGNOSIS — Z96652 Presence of left artificial knee joint: Secondary | ICD-10-CM | POA: Diagnosis not present

## 2018-05-25 DIAGNOSIS — M158 Other polyosteoarthritis: Secondary | ICD-10-CM | POA: Diagnosis not present

## 2018-05-27 DIAGNOSIS — R2681 Unsteadiness on feet: Secondary | ICD-10-CM | POA: Diagnosis not present

## 2018-05-27 DIAGNOSIS — M84463D Pathological fracture, right fibula, subsequent encounter for fracture with routine healing: Secondary | ICD-10-CM | POA: Diagnosis not present

## 2018-05-27 DIAGNOSIS — M158 Other polyosteoarthritis: Secondary | ICD-10-CM | POA: Diagnosis not present

## 2018-05-27 DIAGNOSIS — Z96652 Presence of left artificial knee joint: Secondary | ICD-10-CM | POA: Diagnosis not present

## 2018-05-27 DIAGNOSIS — R2689 Other abnormalities of gait and mobility: Secondary | ICD-10-CM | POA: Diagnosis not present

## 2018-05-28 DIAGNOSIS — E669 Obesity, unspecified: Secondary | ICD-10-CM | POA: Diagnosis not present

## 2018-05-28 DIAGNOSIS — I10 Essential (primary) hypertension: Secondary | ICD-10-CM | POA: Diagnosis not present

## 2018-05-28 DIAGNOSIS — E038 Other specified hypothyroidism: Secondary | ICD-10-CM | POA: Diagnosis not present

## 2018-05-28 DIAGNOSIS — G4700 Insomnia, unspecified: Secondary | ICD-10-CM | POA: Diagnosis not present

## 2018-05-28 DIAGNOSIS — F418 Other specified anxiety disorders: Secondary | ICD-10-CM | POA: Diagnosis not present

## 2018-05-28 DIAGNOSIS — M810 Age-related osteoporosis without current pathological fracture: Secondary | ICD-10-CM | POA: Diagnosis not present

## 2018-05-28 DIAGNOSIS — E785 Hyperlipidemia, unspecified: Secondary | ICD-10-CM | POA: Diagnosis not present

## 2018-05-28 DIAGNOSIS — Z8673 Personal history of transient ischemic attack (TIA), and cerebral infarction without residual deficits: Secondary | ICD-10-CM | POA: Diagnosis not present

## 2018-05-28 DIAGNOSIS — S82831D Other fracture of upper and lower end of right fibula, subsequent encounter for closed fracture with routine healing: Secondary | ICD-10-CM | POA: Diagnosis not present

## 2018-05-28 DIAGNOSIS — Q445 Other congenital malformations of bile ducts: Secondary | ICD-10-CM | POA: Diagnosis not present

## 2018-05-28 DIAGNOSIS — J8410 Pulmonary fibrosis, unspecified: Secondary | ICD-10-CM | POA: Diagnosis not present

## 2018-05-31 DIAGNOSIS — R2689 Other abnormalities of gait and mobility: Secondary | ICD-10-CM | POA: Diagnosis not present

## 2018-05-31 DIAGNOSIS — M158 Other polyosteoarthritis: Secondary | ICD-10-CM | POA: Diagnosis not present

## 2018-05-31 DIAGNOSIS — Z96652 Presence of left artificial knee joint: Secondary | ICD-10-CM | POA: Diagnosis not present

## 2018-05-31 DIAGNOSIS — R2681 Unsteadiness on feet: Secondary | ICD-10-CM | POA: Diagnosis not present

## 2018-05-31 DIAGNOSIS — M84463D Pathological fracture, right fibula, subsequent encounter for fracture with routine healing: Secondary | ICD-10-CM | POA: Diagnosis not present

## 2018-06-01 ENCOUNTER — Encounter: Payer: Self-pay | Source: Ambulatory Visit

## 2018-06-01 ENCOUNTER — Inpatient Hospital Stay: Admit: 2018-06-01 | Payer: Medicare Other | Source: Ambulatory Visit | Admitting: Orthopedic Surgery

## 2018-06-01 DIAGNOSIS — R2689 Other abnormalities of gait and mobility: Secondary | ICD-10-CM | POA: Diagnosis not present

## 2018-06-01 DIAGNOSIS — M158 Other polyosteoarthritis: Secondary | ICD-10-CM | POA: Diagnosis not present

## 2018-06-01 DIAGNOSIS — Z96652 Presence of left artificial knee joint: Secondary | ICD-10-CM | POA: Diagnosis not present

## 2018-06-01 DIAGNOSIS — R2681 Unsteadiness on feet: Secondary | ICD-10-CM | POA: Diagnosis not present

## 2018-06-01 DIAGNOSIS — M84463D Pathological fracture, right fibula, subsequent encounter for fracture with routine healing: Secondary | ICD-10-CM | POA: Diagnosis not present

## 2018-06-01 SURGERY — ARTHROPLASTY, HIP, TOTAL,POSTERIOR APPROACH
Anesthesia: General | Laterality: Left

## 2018-06-03 DIAGNOSIS — M158 Other polyosteoarthritis: Secondary | ICD-10-CM | POA: Diagnosis not present

## 2018-06-03 DIAGNOSIS — M84463D Pathological fracture, right fibula, subsequent encounter for fracture with routine healing: Secondary | ICD-10-CM | POA: Diagnosis not present

## 2018-06-03 DIAGNOSIS — R2681 Unsteadiness on feet: Secondary | ICD-10-CM | POA: Diagnosis not present

## 2018-06-03 DIAGNOSIS — R2689 Other abnormalities of gait and mobility: Secondary | ICD-10-CM | POA: Diagnosis not present

## 2018-06-07 DIAGNOSIS — R2689 Other abnormalities of gait and mobility: Secondary | ICD-10-CM | POA: Diagnosis not present

## 2018-06-07 DIAGNOSIS — M84463D Pathological fracture, right fibula, subsequent encounter for fracture with routine healing: Secondary | ICD-10-CM | POA: Diagnosis not present

## 2018-06-07 DIAGNOSIS — M158 Other polyosteoarthritis: Secondary | ICD-10-CM | POA: Diagnosis not present

## 2018-06-07 DIAGNOSIS — R2681 Unsteadiness on feet: Secondary | ICD-10-CM | POA: Diagnosis not present

## 2018-06-08 DIAGNOSIS — M84463D Pathological fracture, right fibula, subsequent encounter for fracture with routine healing: Secondary | ICD-10-CM | POA: Diagnosis not present

## 2018-06-08 DIAGNOSIS — M158 Other polyosteoarthritis: Secondary | ICD-10-CM | POA: Diagnosis not present

## 2018-06-08 DIAGNOSIS — R2689 Other abnormalities of gait and mobility: Secondary | ICD-10-CM | POA: Diagnosis not present

## 2018-06-08 DIAGNOSIS — R2681 Unsteadiness on feet: Secondary | ICD-10-CM | POA: Diagnosis not present

## 2018-06-09 DIAGNOSIS — R2681 Unsteadiness on feet: Secondary | ICD-10-CM | POA: Diagnosis not present

## 2018-06-09 DIAGNOSIS — M84463D Pathological fracture, right fibula, subsequent encounter for fracture with routine healing: Secondary | ICD-10-CM | POA: Diagnosis not present

## 2018-06-09 DIAGNOSIS — M158 Other polyosteoarthritis: Secondary | ICD-10-CM | POA: Diagnosis not present

## 2018-06-09 DIAGNOSIS — R2689 Other abnormalities of gait and mobility: Secondary | ICD-10-CM | POA: Diagnosis not present

## 2018-06-14 DIAGNOSIS — R2689 Other abnormalities of gait and mobility: Secondary | ICD-10-CM | POA: Diagnosis not present

## 2018-06-14 DIAGNOSIS — M84463D Pathological fracture, right fibula, subsequent encounter for fracture with routine healing: Secondary | ICD-10-CM | POA: Diagnosis not present

## 2018-06-14 DIAGNOSIS — M158 Other polyosteoarthritis: Secondary | ICD-10-CM | POA: Diagnosis not present

## 2018-06-14 DIAGNOSIS — R2681 Unsteadiness on feet: Secondary | ICD-10-CM | POA: Diagnosis not present

## 2018-06-15 DIAGNOSIS — R2681 Unsteadiness on feet: Secondary | ICD-10-CM | POA: Diagnosis not present

## 2018-06-15 DIAGNOSIS — M158 Other polyosteoarthritis: Secondary | ICD-10-CM | POA: Diagnosis not present

## 2018-06-15 DIAGNOSIS — R2689 Other abnormalities of gait and mobility: Secondary | ICD-10-CM | POA: Diagnosis not present

## 2018-06-15 DIAGNOSIS — M84463D Pathological fracture, right fibula, subsequent encounter for fracture with routine healing: Secondary | ICD-10-CM | POA: Diagnosis not present

## 2018-06-17 DIAGNOSIS — M84463D Pathological fracture, right fibula, subsequent encounter for fracture with routine healing: Secondary | ICD-10-CM | POA: Diagnosis not present

## 2018-06-17 DIAGNOSIS — R2689 Other abnormalities of gait and mobility: Secondary | ICD-10-CM | POA: Diagnosis not present

## 2018-06-17 DIAGNOSIS — R2681 Unsteadiness on feet: Secondary | ICD-10-CM | POA: Diagnosis not present

## 2018-06-17 DIAGNOSIS — M158 Other polyosteoarthritis: Secondary | ICD-10-CM | POA: Diagnosis not present

## 2018-06-21 DIAGNOSIS — M158 Other polyosteoarthritis: Secondary | ICD-10-CM | POA: Diagnosis not present

## 2018-06-21 DIAGNOSIS — M84463D Pathological fracture, right fibula, subsequent encounter for fracture with routine healing: Secondary | ICD-10-CM | POA: Diagnosis not present

## 2018-06-21 DIAGNOSIS — R2689 Other abnormalities of gait and mobility: Secondary | ICD-10-CM | POA: Diagnosis not present

## 2018-06-21 DIAGNOSIS — R2681 Unsteadiness on feet: Secondary | ICD-10-CM | POA: Diagnosis not present

## 2018-06-22 DIAGNOSIS — M84463D Pathological fracture, right fibula, subsequent encounter for fracture with routine healing: Secondary | ICD-10-CM | POA: Diagnosis not present

## 2018-06-22 DIAGNOSIS — R2681 Unsteadiness on feet: Secondary | ICD-10-CM | POA: Diagnosis not present

## 2018-06-22 DIAGNOSIS — R2689 Other abnormalities of gait and mobility: Secondary | ICD-10-CM | POA: Diagnosis not present

## 2018-06-22 DIAGNOSIS — M158 Other polyosteoarthritis: Secondary | ICD-10-CM | POA: Diagnosis not present

## 2018-06-23 DIAGNOSIS — R2689 Other abnormalities of gait and mobility: Secondary | ICD-10-CM | POA: Diagnosis not present

## 2018-06-23 DIAGNOSIS — M84463D Pathological fracture, right fibula, subsequent encounter for fracture with routine healing: Secondary | ICD-10-CM | POA: Diagnosis not present

## 2018-06-23 DIAGNOSIS — R2681 Unsteadiness on feet: Secondary | ICD-10-CM | POA: Diagnosis not present

## 2018-06-23 DIAGNOSIS — M158 Other polyosteoarthritis: Secondary | ICD-10-CM | POA: Diagnosis not present

## 2018-06-28 DIAGNOSIS — R2681 Unsteadiness on feet: Secondary | ICD-10-CM | POA: Diagnosis not present

## 2018-06-28 DIAGNOSIS — M158 Other polyosteoarthritis: Secondary | ICD-10-CM | POA: Diagnosis not present

## 2018-06-28 DIAGNOSIS — R2689 Other abnormalities of gait and mobility: Secondary | ICD-10-CM | POA: Diagnosis not present

## 2018-06-28 DIAGNOSIS — M84463D Pathological fracture, right fibula, subsequent encounter for fracture with routine healing: Secondary | ICD-10-CM | POA: Diagnosis not present

## 2018-06-30 DIAGNOSIS — R2689 Other abnormalities of gait and mobility: Secondary | ICD-10-CM | POA: Diagnosis not present

## 2018-06-30 DIAGNOSIS — R2681 Unsteadiness on feet: Secondary | ICD-10-CM | POA: Diagnosis not present

## 2018-06-30 DIAGNOSIS — M158 Other polyosteoarthritis: Secondary | ICD-10-CM | POA: Diagnosis not present

## 2018-06-30 DIAGNOSIS — M84463D Pathological fracture, right fibula, subsequent encounter for fracture with routine healing: Secondary | ICD-10-CM | POA: Diagnosis not present

## 2018-07-02 DIAGNOSIS — R2681 Unsteadiness on feet: Secondary | ICD-10-CM | POA: Diagnosis not present

## 2018-07-02 DIAGNOSIS — R278 Other lack of coordination: Secondary | ICD-10-CM | POA: Diagnosis not present

## 2018-07-02 DIAGNOSIS — R2689 Other abnormalities of gait and mobility: Secondary | ICD-10-CM | POA: Diagnosis not present

## 2018-07-02 DIAGNOSIS — M84463D Pathological fracture, right fibula, subsequent encounter for fracture with routine healing: Secondary | ICD-10-CM | POA: Diagnosis not present

## 2018-07-02 DIAGNOSIS — M6281 Muscle weakness (generalized): Secondary | ICD-10-CM | POA: Diagnosis not present

## 2018-07-02 DIAGNOSIS — Z96642 Presence of left artificial hip joint: Secondary | ICD-10-CM | POA: Diagnosis not present

## 2018-07-02 DIAGNOSIS — Z96652 Presence of left artificial knee joint: Secondary | ICD-10-CM | POA: Diagnosis not present

## 2018-07-02 DIAGNOSIS — M158 Other polyosteoarthritis: Secondary | ICD-10-CM | POA: Diagnosis not present

## 2018-07-05 DIAGNOSIS — R278 Other lack of coordination: Secondary | ICD-10-CM | POA: Diagnosis not present

## 2018-07-05 DIAGNOSIS — M6281 Muscle weakness (generalized): Secondary | ICD-10-CM | POA: Diagnosis not present

## 2018-07-05 DIAGNOSIS — Z96642 Presence of left artificial hip joint: Secondary | ICD-10-CM | POA: Diagnosis not present

## 2018-07-05 DIAGNOSIS — R2681 Unsteadiness on feet: Secondary | ICD-10-CM | POA: Diagnosis not present

## 2018-07-05 DIAGNOSIS — R2689 Other abnormalities of gait and mobility: Secondary | ICD-10-CM | POA: Diagnosis not present

## 2018-07-05 DIAGNOSIS — M158 Other polyosteoarthritis: Secondary | ICD-10-CM | POA: Diagnosis not present

## 2018-07-06 DIAGNOSIS — M6281 Muscle weakness (generalized): Secondary | ICD-10-CM | POA: Diagnosis not present

## 2018-07-06 DIAGNOSIS — R2689 Other abnormalities of gait and mobility: Secondary | ICD-10-CM | POA: Diagnosis not present

## 2018-07-06 DIAGNOSIS — R278 Other lack of coordination: Secondary | ICD-10-CM | POA: Diagnosis not present

## 2018-07-06 DIAGNOSIS — Z96642 Presence of left artificial hip joint: Secondary | ICD-10-CM | POA: Diagnosis not present

## 2018-07-06 DIAGNOSIS — M158 Other polyosteoarthritis: Secondary | ICD-10-CM | POA: Diagnosis not present

## 2018-07-06 DIAGNOSIS — R2681 Unsteadiness on feet: Secondary | ICD-10-CM | POA: Diagnosis not present

## 2018-07-12 ENCOUNTER — Ambulatory Visit: Payer: Self-pay | Admitting: Orthopedic Surgery

## 2018-07-14 ENCOUNTER — Other Ambulatory Visit: Payer: Self-pay | Admitting: Orthopedic Surgery

## 2018-07-15 ENCOUNTER — Other Ambulatory Visit: Payer: Self-pay

## 2018-07-15 ENCOUNTER — Encounter
Admission: RE | Admit: 2018-07-15 | Discharge: 2018-07-15 | Disposition: A | Discharge status: Home / Self Care | Payer: Medicare Other | Source: Ambulatory Visit | Attending: Orthopedic Surgery | Admitting: Orthopedic Surgery

## 2018-07-15 NOTE — Patient Instructions (Signed)
Your procedure is scheduled on: Wed. 5/20 Report to Day Surgery.  Drive to [place] for [employer] 3 hours before your procedure time then go to [employer] and check in at the desk to answer screening questions To find out your arrival time please call [phone] between 1PM - 3PM on Tues. 5/19  Remember: Instructions that are not followed completely may result in serious medical risk,  up to and including death, or upon the discretion of your surgeon and anesthesiologist your  surgery may need to be rescheduled.     _X__ 1. Do not eat food after midnight the night before your procedure.                 No gum chewing or hard candies. You will be given a CHO drink when you arrive in Preop to drink before the surgery.  __X__2.  On the morning of surgery brush your teeth with toothpaste and water, you                may rinse your mouth with mouthwash if you wish.  Do not swallow any toothpaste of mouthwash.     _X__ 3.  No Alcohol for 24 hours before or after surgery.   _X__ 4.  Do Not Smoke or use e-cigarettes For 24 Hours Prior to Your Surgery.                 Do not use any chewable tobacco products for at least 6 hours prior to                 surgery.  ____  5.  Bring all medications with you on the day of surgery if instructed.   _x___  6.  Notify your doctor if there is any change in your medical condition      (cold, fever, infections).     Do not wear jewelry, make-up, hairpins, clips or nail polish. Do not wear lotions, powders, or perfumes. You may wear deodorant. Do not shave 48 hours prior to surgery. Men may shave face and neck. Do not bring valuables to the hospital.    [facility] is not responsible for any belongings or valuables.  Contacts, dentures or bridgework may not be worn into surgery. Leave your suitcase in the car. After surgery it may be brought to your room. For patients admitted to the hospital, discharge time is  determined by your treatment team.   Patients discharged the day of surgery will not be allowed to drive home.   Please read over the following fact sheets that you were given:    _x___ Take these medicines the morning of surgery with A SIP OF WATER:    1.levothyroxine (SYNTHROID, LEVOTHROID) 100 MCG tablet  2.pravastatin (PRAVACHOL) 20 MG tablet   3.   4.  5.  6.  ____ Fleet Enema (as directed)   _x___ Use CHG Soap as directed  ____ Use inhalers on the day of surgery  ____ Stop metformin 2 days prior to surgery    ____ Take 1/2 of usual insulin dose the night before surgery. No insulin the morning          of surgery.   __x__ Stopped Plavix yesterday  __x__ Stop Anti-inflammatories meloxicam (MOBIC) 7.5 MG tablet   ____ Stop supplements until after surgery.    ____ Bring C-Pap to the hospital.

## 2018-07-20 MED ORDER — TRANEXAMIC ACID-NACL 1000-0.7 MG/100ML-% IV SOLN
1000.0000 mg | INTRAVENOUS | Status: AC
  Administered 2018-07-21: 1000 mg via INTRAVENOUS
  Filled 2018-07-20: qty 100

## 2018-07-20 MED ORDER — CEFAZOLIN SODIUM-DEXTROSE 2-4 GM/100ML-% IV SOLN
2.0000 g | INTRAVENOUS | Status: AC
  Administered 2018-07-21: 2 g via INTRAVENOUS

## 2018-07-21 ENCOUNTER — Inpatient Hospital Stay
Admission: RE | Admit: 2018-07-21 | Discharge: 2018-07-23 | DRG: 470 | Disposition: A | Discharge status: Home Health | Payer: Medicare Other | Attending: Orthopedic Surgery | Admitting: Orthopedic Surgery

## 2018-07-21 ENCOUNTER — Inpatient Hospital Stay: Payer: Medicare Other | Admitting: Anesthesiology

## 2018-07-21 ENCOUNTER — Encounter: Payer: Self-pay | Admitting: *Deleted

## 2018-07-21 ENCOUNTER — Other Ambulatory Visit: Payer: Self-pay

## 2018-07-21 ENCOUNTER — Encounter
Admission: RE | Disposition: A | Discharge status: Home Health | Payer: Self-pay | Source: Home / Self Care | Attending: Orthopedic Surgery

## 2018-07-21 ENCOUNTER — Inpatient Hospital Stay: Payer: Medicare Other

## 2018-07-21 ENCOUNTER — Other Ambulatory Visit
Admission: RE | Admit: 2018-07-21 | Discharge: 2018-07-21 | Disposition: A | Discharge status: Home / Self Care | Payer: Medicare Other | Source: Ambulatory Visit | Attending: Orthopedic Surgery | Admitting: Orthopedic Surgery

## 2018-07-21 DIAGNOSIS — E669 Obesity, unspecified: Secondary | ICD-10-CM | POA: Diagnosis present

## 2018-07-21 DIAGNOSIS — Z8673 Personal history of transient ischemic attack (TIA), and cerebral infarction without residual deficits: Secondary | ICD-10-CM

## 2018-07-21 DIAGNOSIS — M25552 Pain in left hip: Secondary | ICD-10-CM | POA: Diagnosis not present

## 2018-07-21 DIAGNOSIS — Z79890 Hormone replacement therapy (postmenopausal): Secondary | ICD-10-CM | POA: Diagnosis not present

## 2018-07-21 DIAGNOSIS — Z461 Encounter for fitting and adjustment of hearing aid: Secondary | ICD-10-CM | POA: Diagnosis not present

## 2018-07-21 DIAGNOSIS — Z7902 Long term (current) use of antithrombotics/antiplatelets: Secondary | ICD-10-CM | POA: Diagnosis not present

## 2018-07-21 DIAGNOSIS — E785 Hyperlipidemia, unspecified: Secondary | ICD-10-CM | POA: Diagnosis present

## 2018-07-21 DIAGNOSIS — G4730 Sleep apnea, unspecified: Secondary | ICD-10-CM | POA: Diagnosis present

## 2018-07-21 DIAGNOSIS — M1612 Unilateral primary osteoarthritis, left hip: Secondary | ICD-10-CM | POA: Diagnosis present

## 2018-07-21 DIAGNOSIS — E039 Hypothyroidism, unspecified: Secondary | ICD-10-CM | POA: Diagnosis present

## 2018-07-21 DIAGNOSIS — Z1159 Encounter for screening for other viral diseases: Secondary | ICD-10-CM | POA: Insufficient documentation

## 2018-07-21 DIAGNOSIS — Z96649 Presence of unspecified artificial hip joint: Secondary | ICD-10-CM

## 2018-07-21 DIAGNOSIS — Z419 Encounter for procedure for purposes other than remedying health state, unspecified: Secondary | ICD-10-CM

## 2018-07-21 DIAGNOSIS — Z791 Long term (current) use of non-steroidal anti-inflammatories (NSAID): Secondary | ICD-10-CM

## 2018-07-21 DIAGNOSIS — I10 Essential (primary) hypertension: Secondary | ICD-10-CM | POA: Diagnosis present

## 2018-07-21 DIAGNOSIS — Z6832 Body mass index (BMI) 32.0-32.9, adult: Secondary | ICD-10-CM

## 2018-07-21 DIAGNOSIS — M810 Age-related osteoporosis without current pathological fracture: Secondary | ICD-10-CM | POA: Diagnosis present

## 2018-07-21 DIAGNOSIS — Z96652 Presence of left artificial knee joint: Secondary | ICD-10-CM | POA: Diagnosis not present

## 2018-07-21 HISTORY — PX: TOTAL HIP ARTHROPLASTY: SHX124

## 2018-07-21 LAB — URINALYSIS, ROUTINE W REFLEX MICROSCOPIC
Bilirubin Urine: NEGATIVE
Glucose, UA: NEGATIVE mg/dL
Hgb urine dipstick: NEGATIVE
Ketones, ur: NEGATIVE mg/dL
Nitrite: NEGATIVE
Protein, ur: NEGATIVE mg/dL
Specific Gravity, Urine: 1.023 (ref 1.005–1.030)
pH: 5 (ref 5.0–8.0)

## 2018-07-21 LAB — SARS CORONAVIRUS 2 BY RT PCR (HOSPITAL ORDER, PERFORMED IN ~~LOC~~ HOSPITAL LAB): SARS Coronavirus 2: NEGATIVE

## 2018-07-21 LAB — CBC
HCT: 40.8 % (ref 36.0–46.0)
Hemoglobin: 13.6 g/dL (ref 12.0–15.0)
MCH: 30.8 pg (ref 26.0–34.0)
MCHC: 33.3 g/dL (ref 30.0–36.0)
MCV: 92.3 fL (ref 80.0–100.0)
Platelets: 272 10*3/uL (ref 150–400)
RBC: 4.42 MIL/uL (ref 3.87–5.11)
RDW: 13.2 % (ref 11.5–15.5)
WBC: 10.2 10*3/uL (ref 4.0–10.5)
nRBC: 0 % (ref 0.0–0.2)

## 2018-07-21 LAB — PROTIME-INR
INR: 1 (ref 0.8–1.2)
Prothrombin Time: 13.4 s (ref 11.4–15.2)

## 2018-07-21 LAB — SURGICAL PCR SCREEN
MRSA, PCR: NEGATIVE
Staphylococcus aureus: NEGATIVE

## 2018-07-21 LAB — ABO/RH: ABO/RH(D): O POS

## 2018-07-21 LAB — TYPE AND SCREEN
ABO/RH(D): O POS
Antibody Screen: NEGATIVE

## 2018-07-21 SURGERY — ARTHROPLASTY, HIP, TOTAL, ANTERIOR APPROACH
Anesthesia: Spinal | Laterality: Left

## 2018-07-21 MED ORDER — SODIUM CHLORIDE 0.9 % IV SOLN
INTRAVENOUS | Status: DC | PRN
  Administered 2018-07-21: 14:00:00 30 ug/min via INTRAVENOUS

## 2018-07-21 MED ORDER — GABAPENTIN 300 MG PO CAPS
300.0000 mg | ORAL_CAPSULE | Freq: Once | ORAL | Status: AC
  Administered 2018-07-21: 10:00:00 300 mg via ORAL

## 2018-07-21 MED ORDER — HYDROCODONE-ACETAMINOPHEN 7.5-325 MG PO TABS: 1.0000 | ORAL_TABLET | ORAL | Status: DC | PRN

## 2018-07-21 MED ORDER — BUPIVACAINE-EPINEPHRINE (PF) 0.25% -1:200000 IJ SOLN
INTRAMUSCULAR | Status: DC | PRN
  Administered 2018-07-21: 20 mL

## 2018-07-21 MED ORDER — FENTANYL CITRATE (PF) 100 MCG/2ML IJ SOLN
INTRAMUSCULAR | Status: AC
  Administered 2018-07-21: 50 ug via INTRAVENOUS
  Filled 2018-07-21: qty 2

## 2018-07-21 MED ORDER — BISACODYL 10 MG RE SUPP: 10.0000 mg | Freq: Every day | RECTAL | Status: DC | PRN

## 2018-07-21 MED ORDER — MENTHOL 3 MG MT LOZG
1.0000 | LOZENGE | OROMUCOSAL | Status: DC | PRN
  Filled 2018-07-21: qty 9

## 2018-07-21 MED ORDER — LIDOCAINE HCL (PF) 2 % IJ SOLN
INTRAMUSCULAR | Status: AC
  Filled 2018-07-21: qty 10

## 2018-07-21 MED ORDER — GLYCOPYRROLATE 0.2 MG/ML IJ SOLN
INTRAMUSCULAR | Status: DC | PRN
  Administered 2018-07-21: 0.2 mg via INTRAVENOUS

## 2018-07-21 MED ORDER — SODIUM CHLORIDE 0.9 % IR SOLN
Status: DC | PRN
  Administered 2018-07-21: 15:00:00

## 2018-07-21 MED ORDER — FAMOTIDINE 20 MG PO TABS
20.0000 mg | ORAL_TABLET | Freq: Once | ORAL | Status: AC
  Administered 2018-07-21: 10:00:00 20 mg via ORAL

## 2018-07-21 MED ORDER — PROPOFOL 500 MG/50ML IV EMUL
INTRAVENOUS | Status: AC
  Filled 2018-07-21: qty 50

## 2018-07-21 MED ORDER — LIDOCAINE HCL (CARDIAC) PF 100 MG/5ML IV SOSY
PREFILLED_SYRINGE | INTRAVENOUS | Status: DC | PRN
  Administered 2018-07-21: 60 mg via INTRAVENOUS

## 2018-07-21 MED ORDER — ACETAMINOPHEN 325 MG PO TABS
325.0000 mg | ORAL_TABLET | Freq: Four times a day (QID) | ORAL | Status: DC | PRN
  Administered 2018-07-23: 650 mg via ORAL
  Filled 2018-07-21: qty 2

## 2018-07-21 MED ORDER — ACETAMINOPHEN 500 MG PO TABS
ORAL_TABLET | ORAL | Status: AC
  Administered 2018-07-21: 1000 mg via ORAL
  Filled 2018-07-21: qty 2

## 2018-07-21 MED ORDER — METOCLOPRAMIDE HCL 5 MG/ML IJ SOLN: 5.0000 mg | Freq: Three times a day (TID) | INTRAMUSCULAR | Status: DC | PRN

## 2018-07-21 MED ORDER — CELECOXIB 200 MG PO CAPS
ORAL_CAPSULE | ORAL | Status: AC
  Administered 2018-07-21: 400 mg via ORAL
  Filled 2018-07-21: qty 2

## 2018-07-21 MED ORDER — MORPHINE SULFATE (PF) 2 MG/ML IV SOLN: 0.5000 mg | INTRAVENOUS | Status: DC | PRN

## 2018-07-21 MED ORDER — CLOPIDOGREL BISULFATE 75 MG PO TABS
75.0000 mg | ORAL_TABLET | Freq: Every day | ORAL | Status: DC
  Administered 2018-07-22 – 2018-07-23 (×2): 75 mg via ORAL
  Filled 2018-07-21 (×2): qty 1

## 2018-07-21 MED ORDER — TRAMADOL HCL 50 MG PO TABS
50.0000 mg | ORAL_TABLET | Freq: Four times a day (QID) | ORAL | Status: DC
  Administered 2018-07-21 – 2018-07-23 (×4): 50 mg via ORAL
  Filled 2018-07-21 (×4): qty 1

## 2018-07-21 MED ORDER — ONDANSETRON HCL 4 MG/2ML IJ SOLN: 4.0000 mg | Freq: Once | INTRAMUSCULAR | Status: DC | PRN

## 2018-07-21 MED ORDER — MAGNESIUM CITRATE PO SOLN
1.0000 | Freq: Once | ORAL | Status: DC | PRN
  Filled 2018-07-21: qty 296

## 2018-07-21 MED ORDER — CEFAZOLIN SODIUM-DEXTROSE 2-4 GM/100ML-% IV SOLN
INTRAVENOUS | Status: AC
  Filled 2018-07-21: qty 100

## 2018-07-21 MED ORDER — MAGNESIUM HYDROXIDE 400 MG/5ML PO SUSP
30.0000 mL | Freq: Every day | ORAL | Status: DC | PRN
  Administered 2018-07-22: 21:00:00 30 mL via ORAL
  Filled 2018-07-21 (×2): qty 30

## 2018-07-21 MED ORDER — HYDROCODONE-ACETAMINOPHEN 5-325 MG PO TABS: 1.0000 | ORAL_TABLET | ORAL | Status: DC | PRN

## 2018-07-21 MED ORDER — ACETAMINOPHEN 500 MG PO TABS
500.0000 mg | ORAL_TABLET | Freq: Four times a day (QID) | ORAL | Status: AC
  Administered 2018-07-21 – 2018-07-22 (×2): 500 mg via ORAL
  Filled 2018-07-21 (×2): qty 1

## 2018-07-21 MED ORDER — CHLORHEXIDINE GLUCONATE 4 % EX LIQD: 60.0000 mL | Freq: Once | CUTANEOUS | Status: DC

## 2018-07-21 MED ORDER — SODIUM CHLORIDE FLUSH 0.9 % IV SOLN
INTRAVENOUS | Status: AC
  Filled 2018-07-21: qty 20

## 2018-07-21 MED ORDER — PRAVASTATIN SODIUM 20 MG PO TABS
20.0000 mg | ORAL_TABLET | Freq: Every day | ORAL | Status: DC
  Administered 2018-07-22 – 2018-07-23 (×2): 20 mg via ORAL
  Filled 2018-07-21 (×2): qty 1

## 2018-07-21 MED ORDER — BACITRACIN 50000 UNITS IM SOLR
INTRAMUSCULAR | Status: AC
  Filled 2018-07-21: qty 2

## 2018-07-21 MED ORDER — LACTATED RINGERS IV SOLN
INTRAVENOUS | Status: DC
  Administered 2018-07-21: 13:00:00 via INTRAVENOUS

## 2018-07-21 MED ORDER — PROPOFOL 500 MG/50ML IV EMUL
INTRAVENOUS | Status: DC | PRN
  Administered 2018-07-21: 40 ug/kg/min via INTRAVENOUS

## 2018-07-21 MED ORDER — PROPOFOL 10 MG/ML IV BOLUS
INTRAVENOUS | Status: AC
  Filled 2018-07-21: qty 20

## 2018-07-21 MED ORDER — BUPIVACAINE HCL (PF) 0.5 % IJ SOLN
INTRAMUSCULAR | Status: DC | PRN
  Administered 2018-07-21: 2.5 mL

## 2018-07-21 MED ORDER — METOCLOPRAMIDE HCL 5 MG PO TABS: 5.0000 mg | ORAL_TABLET | Freq: Three times a day (TID) | ORAL | Status: DC | PRN

## 2018-07-21 MED ORDER — ACETAMINOPHEN 500 MG PO TABS
1000.0000 mg | ORAL_TABLET | Freq: Once | ORAL | Status: AC
  Administered 2018-07-21: 1000 mg via ORAL

## 2018-07-21 MED ORDER — FAMOTIDINE 20 MG PO TABS
ORAL_TABLET | ORAL | Status: AC
  Administered 2018-07-21: 10:00:00 20 mg via ORAL
  Filled 2018-07-21: qty 1

## 2018-07-21 MED ORDER — BUPIVACAINE-EPINEPHRINE (PF) 0.25% -1:200000 IJ SOLN
INTRAMUSCULAR | Status: AC
  Filled 2018-07-21: qty 30

## 2018-07-21 MED ORDER — SODIUM CHLORIDE (PF) 0.9 % IJ SOLN
INTRAMUSCULAR | Status: AC
  Filled 2018-07-21: qty 10

## 2018-07-21 MED ORDER — KETOROLAC TROMETHAMINE 15 MG/ML IJ SOLN
7.5000 mg | Freq: Four times a day (QID) | INTRAMUSCULAR | Status: AC
  Administered 2018-07-21 – 2018-07-22 (×2): 7.5 mg via INTRAVENOUS
  Filled 2018-07-21 (×5): qty 1

## 2018-07-21 MED ORDER — LEVOTHYROXINE SODIUM 100 MCG PO TABS
100.0000 ug | ORAL_TABLET | Freq: Every day | ORAL | Status: DC
  Administered 2018-07-22 – 2018-07-23 (×2): 100 ug via ORAL
  Filled 2018-07-21 (×2): qty 1

## 2018-07-21 MED ORDER — ONDANSETRON HCL 4 MG PO TABS: 4.0000 mg | ORAL_TABLET | Freq: Four times a day (QID) | ORAL | Status: DC | PRN

## 2018-07-21 MED ORDER — GABAPENTIN 300 MG PO CAPS
ORAL_CAPSULE | ORAL | Status: AC
  Administered 2018-07-21: 300 mg via ORAL
  Filled 2018-07-21: qty 1

## 2018-07-21 MED ORDER — PHENOL 1.4 % MT LIQD
1.0000 | OROMUCOSAL | Status: DC | PRN
  Filled 2018-07-21: qty 177

## 2018-07-21 MED ORDER — CEFAZOLIN SODIUM-DEXTROSE 2-4 GM/100ML-% IV SOLN
2.0000 g | Freq: Four times a day (QID) | INTRAVENOUS | Status: AC
  Administered 2018-07-21 – 2018-07-22 (×2): 2 g via INTRAVENOUS
  Filled 2018-07-21 (×2): qty 100

## 2018-07-21 MED ORDER — POLYETHYLENE GLYCOL 3350 17 G PO PACK: 17.0000 g | PACK | Freq: Every day | ORAL | Status: DC | PRN

## 2018-07-21 MED ORDER — ZOLPIDEM TARTRATE 5 MG PO TABS
10.0000 mg | ORAL_TABLET | Freq: Every evening | ORAL | Status: DC | PRN
  Administered 2018-07-21: 21:00:00 10 mg via ORAL
  Filled 2018-07-21: qty 2

## 2018-07-21 MED ORDER — FENTANYL CITRATE (PF) 100 MCG/2ML IJ SOLN
25.0000 ug | INTRAMUSCULAR | Status: DC | PRN
  Administered 2018-07-21 (×3): 50 ug via INTRAVENOUS

## 2018-07-21 MED ORDER — PHENYLEPHRINE HCL (PRESSORS) 10 MG/ML IV SOLN
INTRAVENOUS | Status: DC | PRN
  Administered 2018-07-21 (×3): 200 ug via INTRAVENOUS
  Administered 2018-07-21: 100 ug via INTRAVENOUS
  Administered 2018-07-21 (×2): 200 ug via INTRAVENOUS

## 2018-07-21 MED ORDER — POVIDONE-IODINE 10 % EX SWAB: 2.0000 "application " | Freq: Once | CUTANEOUS | Status: DC

## 2018-07-21 MED ORDER — FENTANYL CITRATE (PF) 100 MCG/2ML IJ SOLN
INTRAMUSCULAR | Status: AC
  Administered 2018-07-21: 15:00:00 50 ug via INTRAVENOUS
  Filled 2018-07-21: qty 2

## 2018-07-21 MED ORDER — LOSARTAN POTASSIUM 50 MG PO TABS
50.0000 mg | ORAL_TABLET | Freq: Every day | ORAL | Status: DC
  Administered 2018-07-22 – 2018-07-23 (×2): 50 mg via ORAL
  Filled 2018-07-21 (×2): qty 1

## 2018-07-21 MED ORDER — ONDANSETRON HCL 4 MG/2ML IJ SOLN: 4.0000 mg | Freq: Four times a day (QID) | INTRAMUSCULAR | Status: DC | PRN

## 2018-07-21 MED ORDER — CELECOXIB 200 MG PO CAPS
400.0000 mg | ORAL_CAPSULE | Freq: Once | ORAL | Status: AC
  Administered 2018-07-21: 400 mg via ORAL

## 2018-07-21 MED ORDER — DOCUSATE SODIUM 100 MG PO CAPS
100.0000 mg | ORAL_CAPSULE | Freq: Two times a day (BID) | ORAL | Status: DC
  Administered 2018-07-21 – 2018-07-23 (×4): 100 mg via ORAL
  Filled 2018-07-21 (×4): qty 1

## 2018-07-21 MED ORDER — LACTATED RINGERS IV SOLN
INTRAVENOUS | Status: DC
  Administered 2018-07-21: 17:00:00 via INTRAVENOUS

## 2018-07-21 SURGICAL SUPPLY — 50 items
APL PRP STRL LF DISP 70% ISPRP (MISCELLANEOUS) ×1
BLADE SAGITTAL WIDE XTHICK NO (BLADE) ×1 IMPLANT
BLADE SAW 25X90X0.89 (BLADE) ×3 IMPLANT
BRUSH SCRUB EZ  4% CHG (MISCELLANEOUS) ×2
BRUSH SCRUB EZ 4% CHG (MISCELLANEOUS) ×2 IMPLANT
CHLORAPREP W/TINT 26 (MISCELLANEOUS) ×3 IMPLANT
COVER HOLE (Hips) ×3 IMPLANT
COVER WAND RF STERILE (DRAPES) ×3 IMPLANT
CUP R3 54MM 3 HOLE (Hips) ×3 IMPLANT
DRAPE C-ARM 42X72 X-RAY (DRAPES) ×1 IMPLANT
DRAPE SHEET LG 3/4 BI-LAMINATE (DRAPES) ×3 IMPLANT
DRAPE STERI IOBAN 125X83 (DRAPES) IMPLANT
DRSG AQUACEL AG ADV 3.5X10 (GAUZE/BANDAGES/DRESSINGS) ×2 IMPLANT
DRSG AQUACEL AG ADV 3.5X14 (GAUZE/BANDAGES/DRESSINGS) IMPLANT
ELECT BLADE 6.5 EXT (BLADE) ×3 IMPLANT
ELECT REM PT RETURN 9FT ADLT (ELECTROSURGICAL) ×3
ELECTRODE REM PT RTRN 9FT ADLT (ELECTROSURGICAL) ×1 IMPLANT
GAUZE XEROFORM 1X8 LF (GAUZE/BANDAGES/DRESSINGS) ×2 IMPLANT
GLOVE INDICATOR 8.0 STRL GRN (GLOVE) ×3 IMPLANT
GLOVE SURG ORTHO 8.0 STRL STRW (GLOVE) ×6 IMPLANT
GOWN STRL REUS W/ TWL LRG LVL3 (GOWN DISPOSABLE) ×1 IMPLANT
GOWN STRL REUS W/ TWL XL LVL3 (GOWN DISPOSABLE) ×1 IMPLANT
GOWN STRL REUS W/TWL LRG LVL3 (GOWN DISPOSABLE) ×3
GOWN STRL REUS W/TWL XL LVL3 (GOWN DISPOSABLE) ×3
HEAD FEM KNEE TAPER 36MM XS-3 (Head) ×2 IMPLANT
HOOD PEEL AWAY FLYTE STAYCOOL (MISCELLANEOUS) ×9 IMPLANT
IV NS 1000ML (IV SOLUTION) ×3
IV NS 1000ML BAXH (IV SOLUTION) ×1 IMPLANT
KIT PATIENT CARE HANA TABLE (KITS) ×3 IMPLANT
KIT TURNOVER CYSTO (KITS) ×3 IMPLANT
LINER ACETABULAR 36X54 OD (Liner) ×2 IMPLANT
MAT ABSORB  FLUID 56X50 GRAY (MISCELLANEOUS) ×2
MAT ABSORB FLUID 56X50 GRAY (MISCELLANEOUS) ×1 IMPLANT
NDL SAFETY ECLIPSE 18X1.5 (NEEDLE) ×2 IMPLANT
NEEDLE HYPO 18GX1.5 SHARP (NEEDLE) ×6
NEEDLE HYPO 22GX1.5 SAFETY (NEEDLE) ×3 IMPLANT
NEEDLE SPNL 20GX3.5 QUINCKE YW (NEEDLE) ×3 IMPLANT
PACK HIP PROSTHESIS (MISCELLANEOUS) ×3 IMPLANT
PADDING CAST BLEND 4X4 NS (MISCELLANEOUS) ×6 IMPLANT
PILLOW ABDUCTION MEDIUM (MISCELLANEOUS) ×3 IMPLANT
PULSAVAC PLUS IRRIG FAN TIP (DISPOSABLE) ×3
SCREW 6.5X25MM (Screw) ×2 IMPLANT
STAPLER SKIN PROX 35W (STAPLE) ×3 IMPLANT
STEM STD COLLAR SZ3 POLARSTEM (Stem) ×2 IMPLANT
SUT BONE WAX W31G (SUTURE) ×1 IMPLANT
SUT DVC 2 QUILL PDO  T11 36X36 (SUTURE) ×2
SUT DVC 2 QUILL PDO T11 36X36 (SUTURE) ×1 IMPLANT
SUT VIC AB 2-0 CT1 18 (SUTURE) ×3 IMPLANT
SYR 20CC LL (SYRINGE) ×3 IMPLANT
TIP FAN IRRIG PULSAVAC PLUS (DISPOSABLE) ×1 IMPLANT

## 2018-07-21 NOTE — Transfer of Care (Signed)
Immediate Anesthesia Transfer of Care Note  Patient: Emily Bartlett  Procedure(s) Performed: TOTAL HIP ARTHROPLASTY ANTERIOR APPROACH (Left )  Patient Location: PACU  Anesthesia Type:Spinal  Level of Consciousness: awake, alert  and oriented  Airway & Oxygen Therapy: Patient Spontanous Breathing  Post-op Assessment: Report given to RN  Post vital signs: Reviewed and stable  Last Vitals:  Vitals Value Taken Time  BP    Temp    Pulse    Resp    SpO2      Last Pain:  Vitals:   07/21/18 0924  TempSrc: Temporal  PainSc: 0-No pain         Complications: No apparent anesthesia complications

## 2018-07-21 NOTE — Op Note (Signed)
07/21/2018  2:50 PM  PATIENT:  Emily Bartlett   MRN: [id number]  PRE-OPERATIVE DIAGNOSIS:  Osteoarthritis left hip   POST-OPERATIVE DIAGNOSIS: Same  Procedure: Left Total Hip Replacement  Surgeon: Elyn Aquas. Harlow Mares, MD   Assist: Carlynn Spry, PA-C  Anesthesia: Spinal   EBL: 200 mL   Specimens: None   Drains: None   Components used: A size 3 Polarstem [employer]hew, R3 size 54 mm shell, and a 36 mm head    Description of the procedure in detail: After informed consent was obtained and the appropriate extremity marked in the pre-operative holding area, the patient was taken to the operating room and placed in the supine position on the fracture table. All pressure points were well padded and bilateral lower extremities were place in traction spars. The hip was prepped and draped in standard sterile fashion. A spinal anesthetic had been delivered by the anesthesia team. The skin and subcutaneous tissues were injected with a mixture of Marcaine with epinephrine for post-operative pain. A longitudinal incision approximately 10 cm in length was carried out from the anterior superior iliac spine to the greater trochanter. The tensor fascia was divided and blunt dissection was taken down to the level of the joint capsule. The lateral circumflex vessels were cauterized. Deep retractors were placed and a portion of the anterior capsule was excised. Using fluoroscopy the neck cut was planned and carried out with a sagittal saw. The head was passed from the field with use of a corkscrew and hip skid. Deep retractors were placed along the acetabulum and the degenerative labrum and large osteophytes were removed with a Rongeur. The cup was sequentially reamed to a size 54 mm. The wound was irrigated and using fluoroscopy the size 54 mm cup was impacted in to anatomic position. A single screw was placed followed by a threaded hole cover. The final liner was impacted in to position. Attention was  then turned to the proximal femur. The leg was placed in extension and external rotation. The canal was opened and sequentially broached to a size 3. The trial components were placed and the hip relocated. The components were found to be in good position using fluoroscopy. The hip was dislocated and the trial components removed. The final components were impacted in to position and the hip relocated. The final components were again check with fluoroscopy and found to be in good position. Hemostasis was achieved with electrocautery. The deep capsule was injected with Marcaine and epinephrine. The wound was irrigated with bacitracin laced normal saline and the tensor fascia closed with #2 Quill suture. The subcutaneous tissues were closed with 2-0 vicryl and staples for the skin. A sterile dressing was applied and an abduction pillow. Patient tolerated the procedure well and there were no apparent complication. Patient was taken to the recovery room in good condition.   Kurtis Bushman, MD

## 2018-07-21 NOTE — H&P (Signed)
The patient has been re-examined, and the chart reviewed, and there have been no interval changes to the documented history and physical.  Plan a left total hip replacement today.  Anesthesia is not consulted regarding a peripheral nerve block for post-operative pain.  The risks, benefits, and alternatives have been discussed at length, and the patient is willing to proceed.

## 2018-07-21 NOTE — Anesthesia Procedure Notes (Signed)
Spinal  Patient location during procedure: OR Start time: 07/21/2018 12:45 PM End time: 07/21/2018 12:55 PM Staffing Anesthesiologist: Emmie Niemann, MD Resident/CRNA: Rona Ravens, CRNA Performed: resident/CRNA and anesthesiologist  Preanesthetic Checklist Completed: patient identified, site marked, surgical consent, pre-op evaluation, timeout performed, IV checked, risks and benefits discussed and monitors and equipment checked Spinal Block Patient position: sitting Prep: ChloraPrep Patient monitoring: heart rate, continuous pulse ox and blood pressure Approach: midline Location: L4-5 Injection technique: single-shot Needle Needle type: Introducer and Pencil-Tip  Needle gauge: 24 G Needle length: 9 cm Additional Notes Negative paresthesia. Negative blood return. Positive free-flowing CSF. Expiration date of kit checked and confirmed. Patient tolerated procedure well, without complications.

## 2018-07-21 NOTE — Anesthesia Post-op Follow-up Note (Signed)
Anesthesia QCDR form completed.        

## 2018-07-21 NOTE — Anesthesia Preprocedure Evaluation (Signed)
Anesthesia Evaluation  Patient identified by MRN, date of birth, ID band Patient awake    Reviewed: Allergy & Precautions, NPO status , Patient's Chart, lab work & pertinent test results  History of Anesthesia Complications Negative for: history of anesthetic complications  Airway Mallampati: II  TM Distance: >3 FB Neck ROM: Full    Dental  (+) Implants, Caps   Pulmonary sleep apnea , neg COPD,    breath sounds clear to auscultation- rhonchi (-) wheezing      Cardiovascular hypertension, Pt. on medications (-) CAD, (-) Past MI, (-) Cardiac Stents and (-) CABG  Rhythm:Regular Rate:Normal - Systolic murmurs and - Diastolic murmurs    Neuro/Psych neg Seizures PSYCHIATRIC DISORDERS Anxiety Depression CVA, No Residual Symptoms    GI/Hepatic negative GI ROS, Neg liver ROS,   Endo/Other  neg diabetesHypothyroidism   Renal/GU negative Renal ROS     Musculoskeletal negative musculoskeletal ROS (+)   Abdominal (+) + obese,   Peds  Hematology negative hematology ROS (+)   Anesthesia Other Findings Past Medical History: No date: Anxiety 04/2007: CVA (cerebral infarction) No date: Depression No date: Hyperlipidemia No date: Hypertension No date: Morton neuroma No date: Osteoporosis No date: Stroke [facility]) No date: Thyroid disease No date: Vitamin D deficiency disease   Reproductive/Obstetrics                             Lab Results  Component Value Date   WBC 10.2 07/21/2018   HGB 13.6 07/21/2018   HCT 40.8 07/21/2018   MCV 92.3 07/21/2018   PLT 272 07/21/2018    Anesthesia Physical Anesthesia Plan  ASA: III  Anesthesia Plan: Spinal   Post-op Pain Management:    Induction:   PONV Risk Score and Plan: 2 and Propofol infusion  Airway Management Planned: Natural Airway  Additional Equipment:   Intra-op Plan:   Post-operative Plan:   Informed Consent: I have reviewed the  patients History and Physical, chart, labs and discussed the procedure including the risks, benefits and alternatives for the proposed anesthesia with the patient or authorized representative who has indicated his/her understanding and acceptance.     Dental advisory given  Plan Discussed with: CRNA and Anesthesiologist  Anesthesia Plan Comments:         Anesthesia Quick Evaluation

## 2018-07-21 NOTE — Progress Notes (Signed)
Pt admitted to floor. Oriented to unit and POC. VSS. In no acute distress. Pt is voiding via pure wic. Incision clean, dry, intact. Pt has 2+ bilateral pedal pulses. Can wiggle toes bilaterally and has tingling to both lower extremities at this time.

## 2018-07-22 ENCOUNTER — Encounter: Payer: Self-pay | Admitting: Orthopedic Surgery

## 2018-07-22 DIAGNOSIS — Z96649 Presence of unspecified artificial hip joint: Secondary | ICD-10-CM

## 2018-07-22 LAB — CBC
HCT: 32.7 % — ABNORMAL LOW (ref 36.0–46.0)
Hemoglobin: 11 g/dL — ABNORMAL LOW (ref 12.0–15.0)
MCH: 31.2 pg (ref 26.0–34.0)
MCHC: 33.6 g/dL (ref 30.0–36.0)
MCV: 92.6 fL (ref 80.0–100.0)
Platelets: 213 10*3/uL (ref 150–400)
RBC: 3.53 MIL/uL — ABNORMAL LOW (ref 3.87–5.11)
RDW: 13.3 % (ref 11.5–15.5)
WBC: 10.3 10*3/uL (ref 4.0–10.5)
nRBC: 0 % (ref 0.0–0.2)

## 2018-07-22 LAB — BASIC METABOLIC PANEL
Anion gap: 10 (ref 5–15)
BUN: 18 mg/dL (ref 8–23)
CO2: 24 mmol/L (ref 22–32)
Calcium: 8.3 mg/dL — ABNORMAL LOW (ref 8.9–10.3)
Chloride: 100 mmol/L (ref 98–111)
Creatinine, Ser: 0.81 mg/dL (ref 0.44–1.00)
GFR calc Af Amer: >60 mL/min (ref >60)
GFR calc non Af Amer: >60 mL/min (ref >60)
Glucose, Bld: 118 mg/dL — ABNORMAL HIGH (ref 70–99)
Potassium: 4 mmol/L (ref 3.5–5.1)
Sodium: 134 mmol/L — ABNORMAL LOW (ref 135–145)

## 2018-07-22 MED ORDER — ZOLPIDEM TARTRATE 5 MG PO TABS
5.0000 mg | ORAL_TABLET | Freq: Every evening | ORAL | Status: DC | PRN
  Administered 2018-07-22: 5 mg via ORAL
  Filled 2018-07-22: qty 1

## 2018-07-22 NOTE — Evaluation (Signed)
Physical Therapy Evaluation Patient Details Name: Emily Bartlett MRN: [id number] DOB: April 20, 1940 Today's Date: 07/22/2018   History of Present Illness  78 y/o female s/p L total hip replacement  Clinical Impression  Pt did well with PT exam and was able to do mobility tasks w/o assist, did need regular redirection to stay on tasks with consistent cuing during exercises and ambulation (~35 ft) but overall did well.  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  Pt with more knee pain during supine exercises (with minimal hip pain) though she did have some increased pain in hip with ambulation/WBing.  She should be able to safely return to her ALF once medically cleared and will benefit from PT on return to that setting.     Follow Up Recommendations Supervision for mobility/OOB(return to ALF ([facility]) with PT services)    Equipment Recommendations       Recommendations for Other Services       Precautions / Restrictions Precautions Precautions: Fall;Anterior Hip Restrictions Weight Bearing Restrictions: Yes LLE Weight Bearing: Weight bearing as tolerated      Mobility  Bed Mobility Overal bed mobility: Modified Independent             General bed mobility comments: Pt needing minimally increased time but no direct assist to get to sitting EO  Transfers Overall transfer level: Modified independent Equipment used: Rolling walker (2 wheeled)             General transfer comment: Pt able to rise with minimal cuing for set up and UE use, not direct assist required to transtion to standing  Ambulation/Gait Ambulation/Gait assistance: Supervision Gait Distance (Feet): 35 Feet Assistive device: Rolling walker (2 wheeled)       General Gait Details: Pt was able to ambulate with slow but safe speed and good overall tolerance. She was reliant on the walker but did not have excessive limp or limitations on L  LE  Stairs            Wheelchair Mobility    Modified Rankin (Stroke Patients Only)       Balance Overall balance assessment: Modified Independent                                           Pertinent Vitals/Pain Pain Assessment: 0-10 Pain Score: 3  Pain Location: c/o more (chronic TKA) L knee pain than hip pain    Home Living Family/patient expects to be discharged to:: Assisted living               Home Equipment: Walker - 4 wheels;Cane - single point      Prior Function Level of Independence: Independent         Comments: Pt reports she has bee working with PT at ALF, able to ambulate with 4WW     Hand Dominance        Extremity/Trunk Assessment   Upper Extremity Assessment Upper Extremity Assessment: Generalized weakness;Overall WFL for tasks assessed    Lower Extremity Assessment Lower Extremity Assessment: Generalized weakness(expected L LE post op weakness, no AROM SLR)       Communication   Communication: No difficulties  Cognition Arousal/Alertness: Awake/alert Behavior During Therapy: WFL for tasks assessed/performed Overall Cognitive Status: Within Functional Limits for tasks assessed  General Comments      Exercises Total Joint Exercises Ankle Circles/Pumps: AROM;10 reps Quad Sets: Strengthening;10 reps Short Arc Quad: AROM;Strengthening;10 reps Heel Slides: AROM;Strengthening;10 reps Hip ABduction/ADduction: Strengthening;AROM;10 reps Knee Flexion: AROM;Strengthening;10 reps   Assessment/Plan    PT Assessment Patient needs continued PT services  PT Problem List         PT Treatment Interventions DME instruction;Gait training;Stair training;Functional mobility training;Therapeutic activities;Therapeutic exercise;Neuromuscular re-education;Balance training;Patient/family education    PT Goals (Current goals can be found in the Care Plan section)   Acute Rehab PT Goals Patient Stated Goal: Return to ALF PT Goal Formulation: With patient Time For Goal Achievement: 08/05/18 Potential to Achieve Goals: Good    Frequency BID   Barriers to discharge        Co-evaluation               AM-PAC PT "6 Clicks" Mobility  Outcome Measure Help needed turning from your back to your side while in a flat bed without using bedrails?: None Help needed moving from lying on your back to sitting on the side of a flat bed without using bedrails?: None Help needed moving to and from a bed to a chair (including a wheelchair)?: A Little Help needed standing up from a chair using your arms (e.g., wheelchair or bedside chair)?: A Little Help needed to walk in hospital room?: A Little Help needed climbing 3-5 steps with a railing? : A Lot 6 Click Score: 19    End of Session Equipment Utilized During Treatment: Gait belt Activity Tolerance: Patient tolerated treatment well Patient left: with chair alarm set;with call bell/phone within reach Nurse Communication: Mobility status PT Visit Diagnosis: Muscle weakness (generalized) (M62.81);Difficulty in walking, not elsewhere classified (R26.2);Pain Pain - Right/Left: Left Pain - part of body: Hip    Time: 0832-0905 PT Time Calculation (min) (ACUTE ONLY): 33 min   Charges:   PT Evaluation $PT Eval Low Complexity: 1 Low PT Treatments $Gait Training: 8-22 mins $Therapeutic Exercise: 8-22 mins        Kreg Shropshire, DPT 07/22/2018, 10:18 AM

## 2018-07-22 NOTE — TOC Progression Note (Signed)
Transition of Care [facility]) - Progression Note    Patient Details  Name: Emily Bartlett MRN: [id number] Date of Birth: November 12, 1940  [facility]) CM/SW Ranier, RN Phone Number: 07/22/2018, 10:38 AM  Clinical Narrative:      Went in to see patient to discuss DC plan, she is sleeping soundly and doesn't want to be disturbed at this time, will complete assessment at a later time      Expected Discharge Plan and Services           Expected Discharge Date: 07/23/18                                     Social Determinants of Health (SDOH) Interventions    Readmission Risk Interventions No flowsheet data found.

## 2018-07-22 NOTE — Progress Notes (Signed)
Physical Therapy Treatment Patient Details Name: Emily Bartlett MRN: [id number] DOB: [birth year] Today's Date: 07/22/2018    History of Present Illness 78 y/o female s/p L total hip replacement    PT Comments    Pt talkative t/o session but easily cued to stay on task.  Pt participated well with exercises and though she had some fatigue and pain with the effort ultimately did well.  She was able to increase ambulation distance well with increased speed and cadence this afternoon and though she is still unable to do SLRs she showed increased strength with LE exercises and generally is progressing as expected post total hip replacement.     Follow Up Recommendations  Supervision for mobility/OOB(return to ALF with PT)     Equipment Recommendations       Recommendations for Other Services       Precautions / Restrictions Precautions Precautions: Fall;Anterior Hip Restrictions LLE Weight Bearing: Weight bearing as tolerated    Mobility  Bed Mobility Overal bed mobility: Modified Independent             General bed mobility comments: Pt able to get herself to sitting EOB without assist  Transfers Overall transfer level: Modified independent Equipment used: Rolling walker (2 wheeled)             General transfer comment: Pt again able to rise to standing w/o phyiscal assist, cuing to insure appropriate UE use  Ambulation/Gait Ambulation/Gait assistance: Supervision Gait Distance (Feet): 80 Feet Assistive device: Rolling walker (2 wheeled)       General Gait Details: Pt with increased tolerance with ambulation this session, still with slow but safe cadence.  She had some fatigue but is making expected gains.  Cues for improved mechanics (knee and hip) as well as walker use.   Stairs             Wheelchair Mobility    Modified Rankin (Stroke Patients Only)       Balance Overall balance assessment: Modified Independent                                           Cognition Arousal/Alertness: Awake/alert Behavior During Therapy: WFL for tasks assessed/performed Overall Cognitive Status: Within Functional Limits for tasks assessed                                        Exercises Total Joint Exercises Ankle Circles/Pumps: AROM;10 reps Quad Sets: Strengthening;10 reps Short Arc Quad: Strengthening;10 reps(lacks full TKE on L) Heel Slides: AROM;Strengthening;10 reps(with resisted leg extensions) Hip ABduction/ADduction: Strengthening;AROM;10 reps Knee Flexion: AROM;Strengthening;10 reps    General Comments        Pertinent Vitals/Pain Pain Score: 4     Home Living                      Prior Function            PT Goals (current goals can now be found in the care plan section) Progress towards PT goals: Progressing toward goals    Frequency    BID      PT Plan Current plan remains appropriate    Co-evaluation              AM-PAC PT "6 Clicks" Mobility  Outcome Measure  Help needed turning from your back to your side while in a flat bed without using bedrails?: None Help needed moving from lying on your back to sitting on the side of a flat bed without using bedrails?: None Help needed moving to and from a bed to a chair (including a wheelchair)?: A Little Help needed standing up from a chair using your arms (e.g., wheelchair or bedside chair)?: None Help needed to walk in hospital room?: A Little Help needed climbing 3-5 steps with a railing? : A Lot 6 Click Score: 20    End of Session Equipment Utilized During Treatment: Gait belt Activity Tolerance: Patient tolerated treatment well Patient left: with chair alarm set;with call bell/phone within reach Nurse Communication: Mobility status PT Visit Diagnosis: Muscle weakness (generalized) (M62.81);Difficulty in walking, not elsewhere classified (R26.2);Pain Pain - Right/Left: Left Pain - part of body:  Hip     Time: 3151-7616 PT Time Calculation (min) (ACUTE ONLY): 32 min  Charges:  $Gait Training: 8-22 mins $Therapeutic Exercise: 8-22 mins                     Kreg Shropshire, DPT 07/22/2018, 3:18 PM

## 2018-07-22 NOTE — Progress Notes (Signed)
  Subjective:  Patient reports pain as mild.    Objective:   VITALS:   Vitals:   07/21/18 1931 07/22/18 0032 07/22/18 0449 07/22/18 0928  BP: 133/71 125/63 127/70 128/73  Pulse: 69 82 79 93  Resp: 18 18 18    Temp: 97.8 F (36.6 C) 98.3 F (36.8 C) 98.9 F (37.2 C)   TempSrc:      SpO2: 100% 96% 96% 97%  Weight:      Height:        PHYSICAL EXAM:  Sensation intact distally Dorsiflexion/Plantar flexion intact Incision: dressing C/D/I Compartment soft  LABS  Results for orders placed or performed during the hospital encounter of 07/21/18 (from the past 24 hour(s))  CBC     Status: Abnormal   Collection Time: 07/22/18  4:20 AM  Result Value Ref Range   WBC 10.3 4.0 - 10.5 K/uL   RBC 3.53 (L) 3.87 - 5.11 MIL/uL   Hemoglobin 11.0 (L) 12.0 - 15.0 g/dL   HCT 32.7 (L) 36.0 - 46.0 %   MCV 92.6 80.0 - 100.0 fL   MCH 31.2 26.0 - 34.0 pg   MCHC 33.6 30.0 - 36.0 g/dL   RDW 13.3 11.5 - 15.5 %   Platelets 213 150 - 400 K/uL   nRBC 0.0 0.0 - 0.2 %  Basic metabolic panel     Status: Abnormal   Collection Time: 07/22/18  4:20 AM  Result Value Ref Range   Sodium 134 (L) 135 - 145 mmol/L   Potassium 4.0 3.5 - 5.1 mmol/L   Chloride 100 98 - 111 mmol/L   CO2 24 22 - 32 mmol/L   Glucose, Bld 118 (H) 70 - 99 mg/dL   BUN 18 8 - 23 mg/dL   Creatinine, Ser 0.81 0.44 - 1.00 mg/dL   Calcium 8.3 (L) 8.9 - 10.3 mg/dL   GFR calc non Af Amer >60 >60 mL/min   GFR calc Af Amer >60 >60 mL/min   Anion gap 10 5 - 15    Dg Hip Operative Unilat W Or W/o Pelvis Left  Result Date: 07/21/2018 CLINICAL DATA:  Anterior approach left hip replacement. EXAM: OPERATIVE LEFT HIP (WITH PELVIS IF PERFORMED) 3 VIEWS TECHNIQUE: Fluoroscopic spot image(s) were submitted for interpretation post-operatively. FLUOROSCOPY TIME:  14 seconds COMPARISON:  None. FINDINGS: Three spot intraoperative fluoroscopic images of the left hip are provided for review Post left total hip replacement. Alignment appears anatomic  given projection and coned field of view. There is a minimal amount of expected subcutaneous emphysema about the operative site. No radiopaque foreign body. IMPRESSION: Post left total hip replacement without evidence of complication Electronically Signed   By: Sandi Mariscal M.D.   On: 07/21/2018 14:49    Assessment/Plan: 1 Day Post-Op   Principal Problem:   Status post THR (total hip replacement)   Up with therapy Plan for discharge tomorrow   Lovell Sheehan , MD 07/22/2018, 12:24 PM

## 2018-07-22 NOTE — Anesthesia Postprocedure Evaluation (Signed)
Anesthesia Post Note  Patient: Emily Bartlett  Procedure(s) Performed: TOTAL HIP ARTHROPLASTY ANTERIOR APPROACH (Left )  Patient location during evaluation: Nursing Unit Anesthesia Type: Spinal Level of consciousness: oriented and awake and alert Pain management: pain level controlled Vital Signs Assessment: post-procedure vital signs reviewed and stable Respiratory status: spontaneous breathing and respiratory function stable Cardiovascular status: blood pressure returned to baseline and stable Postop Assessment: no headache, no backache, no apparent nausea or vomiting and patient able to bend at knees Anesthetic complications: no     Last Vitals:  Vitals:   07/22/18 0449 07/22/18 0928  BP: 127/70 128/73  Pulse: 79 93  Resp: 18   Temp: 37.2 C   SpO2: 96% 97%    Last Pain:  Vitals:   07/22/18 0730  TempSrc:   PainSc: 0-No pain                 Tonika Eden Lorenza Chick

## 2018-07-23 LAB — CBC
HCT: 32.6 % — ABNORMAL LOW (ref 36.0–46.0)
Hemoglobin: 10.9 g/dL — ABNORMAL LOW (ref 12.0–15.0)
MCH: 30.8 pg (ref 26.0–34.0)
MCHC: 33.4 g/dL (ref 30.0–36.0)
MCV: 92.1 fL (ref 80.0–100.0)
Platelets: 218 10*3/uL (ref 150–400)
RBC: 3.54 MIL/uL — ABNORMAL LOW (ref 3.87–5.11)
RDW: 13.3 % (ref 11.5–15.5)
WBC: 12.5 10*3/uL — ABNORMAL HIGH (ref 4.0–10.5)
nRBC: 0 % (ref 0.0–0.2)

## 2018-07-23 LAB — SURGICAL PATHOLOGY

## 2018-07-23 MED ORDER — DOCUSATE SODIUM 100 MG PO CAPS: 100.0000 mg | ORAL_CAPSULE | Freq: Two times a day (BID) | ORAL | 0 refills | Status: DC

## 2018-07-23 NOTE — Progress Notes (Signed)
  Subjective:  Patient reports pain as mild.  Resting comfortably  Objective:   VITALS:   Vitals:   07/22/18 0928 07/22/18 1536 07/22/18 2102 07/23/18 0405  BP: 128/73 136/70 123/67 129/69  Pulse: 93 99 97 87  Resp:  17 18 18   Temp:  98.7 F (37.1 C) (!) 100.4 F (38 C) 98.8 F (37.1 C)  TempSrc:  Oral Oral Oral  SpO2: 97% 95% 96% 94%  Weight:      Height:        PHYSICAL EXAM:  Neurologically intact ABD soft Neurovascular intact Sensation intact distally Intact pulses distally Dorsiflexion/Plantar flexion intact Incision: dressing C/D/I No cellulitis present Compartment soft  LABS  Results for orders placed or performed during the hospital encounter of 07/21/18 (from the past 24 hour(s))  CBC     Status: Abnormal   Collection Time: 07/23/18  4:32 AM  Result Value Ref Range   WBC 12.5 (H) 4.0 - 10.5 K/uL   RBC 3.54 (L) 3.87 - 5.11 MIL/uL   Hemoglobin 10.9 (L) 12.0 - 15.0 g/dL   HCT 32.6 (L) 36.0 - 46.0 %   MCV 92.1 80.0 - 100.0 fL   MCH 30.8 26.0 - 34.0 pg   MCHC 33.4 30.0 - 36.0 g/dL   RDW 13.3 11.5 - 15.5 %   Platelets 218 150 - 400 K/uL   nRBC 0.0 0.0 - 0.2 %    Dg Hip Operative Unilat W Or W/o Pelvis Left  Result Date: 07/21/2018 CLINICAL DATA:  Anterior approach left hip replacement. EXAM: OPERATIVE LEFT HIP (WITH PELVIS IF PERFORMED) 3 VIEWS TECHNIQUE: Fluoroscopic spot image(s) were submitted for interpretation post-operatively. FLUOROSCOPY TIME:  14 seconds COMPARISON:  None. FINDINGS: Three spot intraoperative fluoroscopic images of the left hip are provided for review Post left total hip replacement. Alignment appears anatomic given projection and coned field of view. There is a minimal amount of expected subcutaneous emphysema about the operative site. No radiopaque foreign body. IMPRESSION: Post left total hip replacement without evidence of complication Electronically Signed   By: Sandi Mariscal M.D.   On: 07/21/2018 14:49    Assessment/Plan: 2 Days  Post-Op   Principal Problem:   Status post THR (total hip replacement)   Advance diet Up with therapy Discharge home with home health   Carlynn Spry , PA-C 07/23/2018, 6:41 AM

## 2018-07-23 NOTE — TOC Transition Note (Signed)
Transition of Care (TOC) - CM/SW Discharge Note   Patient Details  Name: Emily Bartlett MRN: [id number] Date of Birth: 04/26/1940  Transition of Care (TOC) CM/SW Contact:  Deliliah J Gregory, RN Phone Number: 07/23/2018, 9:09 AM   Clinical Narrative:       Final next level of care: Assisted Living     Patient Goals and CMS Choice Patient states their goals for this hospitalization and ongoing recovery are:: going home CMS Medicare.gov Compare Post Acute Care list provided to:: Patient Choice offered to / list presented to : Patient  Discharge Placement             Met with the patient to discuss DC plans and needs,SHe lives in independent living at [facility] but will move over to assisted if needed  she will need a RW and 3 in 1, notified Brad with [employer].  She will use [facility] for HH PT and OT but will need the order printed to take with her to start right away, I sent a message to Dr Bowers and Maurice Jones requesting the order so that she can take it with her   He is provided transportation with [facility] and she called them while I was in the room to make sure they would be able to pick her up today She sees Dr Griffin for PCP [facility] is her pharmacy of choice  Patient states that she has no other needs at this time               Discharge Plan and Services   Discharge Planning Services: CM Consult Post Acute Care Choice: Home Health(will be using PT/OT at [facility] where she [facility])          DME Arranged: Walker rolling DME Agency: [employer] Date DME Agency Contacted: 07/16/18 Time DME Agency Contacted: [phone] Representative spoke with at DME Agency: Brad HH Arranged: PT, OT(arranged for [facility] to do therapy)          Social Determinants of Health (SDOH) Interventions     Readmission Risk Interventions No flowsheet data found.     

## 2018-07-23 NOTE — Discharge Summary (Signed)
Physician Discharge Summary  Patient ID: Emily Bartlett MRN: [id number] DOB/AGE: 05-20-1940 78 y.o.  Admit date: 07/21/2018 Discharge date: 07/23/2018  Admission Diagnoses:  OSTEOARTHRITIS Status post THR (total hip replacement)  Discharge Diagnoses:  OSTEOARTHRITIS Principal Problem:   Status post THR (total hip replacement)   Past Medical History:  Diagnosis Date  . Anxiety   . CVA (cerebral infarction) 04/2007  . Depression   . Hyperlipidemia   . Hypertension   . Morton neuroma   . Osteoporosis   . Stroke ([place])   . Thyroid disease   . Vitamin D deficiency disease     Surgeries: Procedure(s): TOTAL HIP ARTHROPLASTY ANTERIOR APPROACH on 07/21/2018   Consultants (if any):   Discharged Condition: Improved  Hospital Course: Emily Bartlett is an 78 y.o. female who was admitted 07/21/2018 with a diagnosis of  OSTEOARTHRITIS Status post THR (total hip replacement) and went to the operating room on 07/21/2018 and underwent the above named procedures.    She was given perioperative antibiotics:  Anti-infectives (From admission, onward)   Start     Dose/Rate Route Frequency Ordered Stop   07/21/18 1900  ceFAZolin (ANCEF) IVPB 2g/100 mL premix     2 g 200 mL/hr over 30 Minutes Intravenous Every 6 hours 07/21/18 1700 07/22/18 0240   07/21/18 1440  50,000 units bacitracin in 0.9% normal saline 250 mL irrigation  Status:  Discontinued       As needed 07/21/18 1441 07/21/18 1457   07/21/18 0933  ceFAZolin (ANCEF) 2-4 GM/100ML-% IVPB    Note to Pharmacy:  Norton Blizzard  : cabinet override      07/21/18 0933 07/21/18 1300   07/21/18 0600  ceFAZolin (ANCEF) IVPB 2g/100 mL premix     2 g 200 mL/hr over 30 Minutes Intravenous On call to O.R. 07/20/18 2205 07/21/18 1300    .  She was given sequential compression devices, early ambulation, and plavix for DVT prophylaxis.  She benefited maximally from the hospital stay and there were no complications.    Recent  vital signs:  Vitals:   07/22/18 2102 07/23/18 0405  BP: 123/67 129/69  Pulse: 97 87  Resp: 18 18  Temp: (!) 100.4 F (38 C) 98.8 F (37.1 C)  SpO2: 96% 94%    Recent laboratory studies:  Lab Results  Component Value Date   HGB 10.9 (L) 07/23/2018   HGB 11.0 (L) 07/22/2018   HGB 13.6 07/21/2018   Lab Results  Component Value Date   WBC 12.5 (H) 07/23/2018   PLT 218 07/23/2018   Lab Results  Component Value Date   INR 1.0 07/21/2018   Lab Results  Component Value Date   NA 134 (L) 07/22/2018   K 4.0 07/22/2018   CL 100 07/22/2018   CO2 24 07/22/2018   BUN 18 07/22/2018   CREATININE 0.81 07/22/2018   GLUCOSE 118 (H) 07/22/2018    Discharge Medications:   Allergies as of 07/23/2018   No Known Allergies     Medication List    TAKE these medications   clopidogrel 75 MG tablet Commonly known as:  PLAVIX Take 1 tablet (75 mg total) by mouth daily.   docusate sodium 100 MG capsule Commonly known as:  COLACE Take 1 capsule (100 mg total) by mouth 2 (two) times daily.   levothyroxine 100 MCG tablet Commonly known as:  SYNTHROID Take 1 tablet (100 mcg total) by mouth daily.   losartan 100 MG tablet Commonly known as:  COZAAR Take  0.5 tablets (50 mg total) by mouth daily. What changed:  how much to take   meloxicam 7.5 MG tablet Commonly known as:  MOBIC Take 1 tablet (7.5 mg total) by mouth daily.   polyethylene glycol 17 g packet Commonly known as:  MIRALAX / GLYCOLAX Take 17 g by mouth daily as needed. What changed:  reasons to take this   pravastatin 20 MG tablet Commonly known as:  PRAVACHOL Take 1 tablet (20 mg total) by mouth daily.   traMADol 50 MG tablet Commonly known as:  ULTRAM Take 50 mg by mouth every 6 (six) hours as needed (pain).   zolpidem 10 MG tablet Commonly known as:  AMBIEN Take 1 tablet (10 mg total) by mouth at bedtime as needed. What changed:    how much to take  reasons to take this       Diagnostic Studies: Dg  Hip Operative Unilat W Or W/o Pelvis Left  Result Date: 07/21/2018 CLINICAL DATA:  Anterior approach left hip replacement. EXAM: OPERATIVE LEFT HIP (WITH PELVIS IF PERFORMED) 3 VIEWS TECHNIQUE: Fluoroscopic spot image(s) were submitted for interpretation post-operatively. FLUOROSCOPY TIME:  14 seconds COMPARISON:  None. FINDINGS: Three spot intraoperative fluoroscopic images of the left hip are provided for review Post left total hip replacement. Alignment appears anatomic given projection and coned field of view. There is a minimal amount of expected subcutaneous emphysema about the operative site. No radiopaque foreign body. IMPRESSION: Post left total hip replacement without evidence of complication Electronically Signed   By: Sandi Mariscal M.D.   On: 07/21/2018 14:49    Disposition: Discharge disposition: 01-Home or Self Care            Signed: Carlynn Spry ,MD 07/23/2018, 6:40 AM

## 2018-07-23 NOTE — Discharge Instructions (Signed)

## 2018-07-23 NOTE — Progress Notes (Signed)
Pt. Discharged to [facility] via facility transportation. Discharge instructions and medication regimen reviewed at bedside with patient. Pt. verbalizes understanding of instructions and medication regimen. Patient assessment unchanged from this morning. IV discontinued per policy.

## 2018-07-23 NOTE — Progress Notes (Signed)
Physical Therapy Treatment Patient Details Name: Emily Bartlett MRN: [id number] DOB: 1940/05/11 Today's Date: 07/23/2018    History of Present Illness 78 y/o female s/p L total hip replacement    PT Comments    Pt continues to be very eager to work with PT but needs frequent redirection and cuing to stay on task.  She was able to do most bed mobility/transfers with only CGA but again needed light cuing t/o each activity to focus and perform appropriately. She showed good confidence with ambulation though she did have fatigue with the prolonged effort and needed cues for appropriate walker use/positioning.    Follow Up Recommendations  Supervision for mobility/OOB     Equipment Recommendations       Recommendations for Other Services       Precautions / Restrictions Precautions Precautions: Fall;Anterior Hip Restrictions LLE Weight Bearing: Weight bearing as tolerated    Mobility  Bed Mobility Overal bed mobility: Modified Independent             General bed mobility comments: Pt able to get herself to sitting EOB without assist  Transfers Overall transfer level: Modified independent Equipment used: Rolling walker (2 wheeled)             General transfer comment: Pt able to rise to standing w/o phyiscal assist, remembered appropriate UE usage  Ambulation/Gait Ambulation/Gait assistance: Supervision Gait Distance (Feet): 200 Feet Assistive device: Rolling walker (2 wheeled)       General Gait Details: Pt with increased tolerance with ambulation this session, still with slow but safe cadence.  She had some fatigue but is making expected gains.  Cues for improved mechanics (knee and hip) as well as walker use, especially letting walker get too far out in front of her.  Pt's walker to take with her arrived and PT set to correct height and trialed ambulation with it today.   Stairs             Wheelchair Mobility    Modified Rankin (Stroke  Patients Only)       Balance Overall balance assessment: Modified Independent                                          Cognition Arousal/Alertness: Awake/alert Behavior During Therapy: WFL for tasks assessed/performed Overall Cognitive Status: Difficult to assess                                 General Comments: Pt continues to repeat herself regularly but is alert and aware enough to situation to partcipate well and safely      Exercises Total Joint Exercises Ankle Circles/Pumps: AROM;10 reps Quad Sets: Strengthening;10 reps Short Arc Quad: Strengthening;15 reps Heel Slides: AROM;Strengthening;15 reps Hip ABduction/ADduction: Strengthening;AROM;15 reps Knee Flexion: AROM;15 reps;Strengthening    General Comments        Pertinent Vitals/Pain Pain Score: 3  Pain Location: c/o more (chronic TKA) L knee pain than hip pain    Home Living                      Prior Function            PT Goals (current goals can now be found in the care plan section) Progress towards PT goals: Progressing toward goals    Frequency  BID      PT Plan Current plan remains appropriate    Co-evaluation              AM-PAC PT "6 Clicks" Mobility   Outcome Measure  Help needed turning from your back to your side while in a flat bed without using bedrails?: None Help needed moving from lying on your back to sitting on the side of a flat bed without using bedrails?: None Help needed moving to and from a bed to a chair (including a wheelchair)?: A Little Help needed standing up from a chair using your arms (e.g., wheelchair or bedside chair)?: None Help needed to walk in hospital room?: A Little Help needed climbing 3-5 steps with a railing? : A Lot 6 Click Score: 20    End of Session Equipment Utilized During Treatment: Gait belt Activity Tolerance: Patient tolerated treatment well Patient left: with chair alarm set;with call  bell/phone within reach   PT Visit Diagnosis: Muscle weakness (generalized) (M62.81);Difficulty in walking, not elsewhere classified (R26.2);Pain Pain - Right/Left: Left Pain - part of body: Hip     Time: 0925-1006 PT Time Calculation (min) (ACUTE ONLY): 41 min  Charges:  $Gait Training: 8-22 mins $Therapeutic Exercise: 8-22 mins $Therapeutic Activity: 8-22 mins                     Kreg Shropshire, DPT 07/23/2018, 10:47 AM

## 2018-07-27 DIAGNOSIS — R2681 Unsteadiness on feet: Secondary | ICD-10-CM | POA: Diagnosis not present

## 2018-07-27 DIAGNOSIS — R2689 Other abnormalities of gait and mobility: Secondary | ICD-10-CM | POA: Diagnosis not present

## 2018-07-27 DIAGNOSIS — M158 Other polyosteoarthritis: Secondary | ICD-10-CM | POA: Diagnosis not present

## 2018-07-27 DIAGNOSIS — R278 Other lack of coordination: Secondary | ICD-10-CM | POA: Diagnosis not present

## 2018-07-27 DIAGNOSIS — Z96642 Presence of left artificial hip joint: Secondary | ICD-10-CM | POA: Diagnosis not present

## 2018-07-27 DIAGNOSIS — M6281 Muscle weakness (generalized): Secondary | ICD-10-CM | POA: Diagnosis not present

## 2018-07-28 DIAGNOSIS — Z96642 Presence of left artificial hip joint: Secondary | ICD-10-CM | POA: Diagnosis not present

## 2018-07-28 DIAGNOSIS — R278 Other lack of coordination: Secondary | ICD-10-CM | POA: Diagnosis not present

## 2018-07-28 DIAGNOSIS — M158 Other polyosteoarthritis: Secondary | ICD-10-CM | POA: Diagnosis not present

## 2018-07-28 DIAGNOSIS — R2689 Other abnormalities of gait and mobility: Secondary | ICD-10-CM | POA: Diagnosis not present

## 2018-07-28 DIAGNOSIS — R2681 Unsteadiness on feet: Secondary | ICD-10-CM | POA: Diagnosis not present

## 2018-07-28 DIAGNOSIS — M6281 Muscle weakness (generalized): Secondary | ICD-10-CM | POA: Diagnosis not present

## 2018-07-29 DIAGNOSIS — M158 Other polyosteoarthritis: Secondary | ICD-10-CM | POA: Diagnosis not present

## 2018-07-29 DIAGNOSIS — R278 Other lack of coordination: Secondary | ICD-10-CM | POA: Diagnosis not present

## 2018-07-29 DIAGNOSIS — R2681 Unsteadiness on feet: Secondary | ICD-10-CM | POA: Diagnosis not present

## 2018-07-29 DIAGNOSIS — R2689 Other abnormalities of gait and mobility: Secondary | ICD-10-CM | POA: Diagnosis not present

## 2018-07-29 DIAGNOSIS — Z96642 Presence of left artificial hip joint: Secondary | ICD-10-CM | POA: Diagnosis not present

## 2018-07-29 DIAGNOSIS — M6281 Muscle weakness (generalized): Secondary | ICD-10-CM | POA: Diagnosis not present

## 2018-07-30 DIAGNOSIS — M158 Other polyosteoarthritis: Secondary | ICD-10-CM | POA: Diagnosis not present

## 2018-07-30 DIAGNOSIS — Z96642 Presence of left artificial hip joint: Secondary | ICD-10-CM | POA: Diagnosis not present

## 2018-07-30 DIAGNOSIS — R2681 Unsteadiness on feet: Secondary | ICD-10-CM | POA: Diagnosis not present

## 2018-07-30 DIAGNOSIS — R278 Other lack of coordination: Secondary | ICD-10-CM | POA: Diagnosis not present

## 2018-07-30 DIAGNOSIS — R2689 Other abnormalities of gait and mobility: Secondary | ICD-10-CM | POA: Diagnosis not present

## 2018-07-30 DIAGNOSIS — M6281 Muscle weakness (generalized): Secondary | ICD-10-CM | POA: Diagnosis not present

## 2018-08-02 DIAGNOSIS — M84463D Pathological fracture, right fibula, subsequent encounter for fracture with routine healing: Secondary | ICD-10-CM | POA: Diagnosis not present

## 2018-08-02 DIAGNOSIS — M158 Other polyosteoarthritis: Secondary | ICD-10-CM | POA: Diagnosis not present

## 2018-08-02 DIAGNOSIS — R2689 Other abnormalities of gait and mobility: Secondary | ICD-10-CM | POA: Diagnosis not present

## 2018-08-02 DIAGNOSIS — Z96642 Presence of left artificial hip joint: Secondary | ICD-10-CM | POA: Diagnosis not present

## 2018-08-02 DIAGNOSIS — R2681 Unsteadiness on feet: Secondary | ICD-10-CM | POA: Diagnosis not present

## 2018-08-02 DIAGNOSIS — M6281 Muscle weakness (generalized): Secondary | ICD-10-CM | POA: Diagnosis not present

## 2018-08-02 DIAGNOSIS — R278 Other lack of coordination: Secondary | ICD-10-CM | POA: Diagnosis not present

## 2018-08-03 DIAGNOSIS — Z96642 Presence of left artificial hip joint: Secondary | ICD-10-CM | POA: Diagnosis not present

## 2018-08-03 DIAGNOSIS — M6281 Muscle weakness (generalized): Secondary | ICD-10-CM | POA: Diagnosis not present

## 2018-08-03 DIAGNOSIS — R278 Other lack of coordination: Secondary | ICD-10-CM | POA: Diagnosis not present

## 2018-08-03 DIAGNOSIS — M158 Other polyosteoarthritis: Secondary | ICD-10-CM | POA: Diagnosis not present

## 2018-08-03 DIAGNOSIS — R2689 Other abnormalities of gait and mobility: Secondary | ICD-10-CM | POA: Diagnosis not present

## 2018-08-03 DIAGNOSIS — R2681 Unsteadiness on feet: Secondary | ICD-10-CM | POA: Diagnosis not present

## 2018-08-05 DIAGNOSIS — R278 Other lack of coordination: Secondary | ICD-10-CM | POA: Diagnosis not present

## 2018-08-05 DIAGNOSIS — R2689 Other abnormalities of gait and mobility: Secondary | ICD-10-CM | POA: Diagnosis not present

## 2018-08-05 DIAGNOSIS — M6281 Muscle weakness (generalized): Secondary | ICD-10-CM | POA: Diagnosis not present

## 2018-08-05 DIAGNOSIS — Z96642 Presence of left artificial hip joint: Secondary | ICD-10-CM | POA: Diagnosis not present

## 2018-08-05 DIAGNOSIS — M158 Other polyosteoarthritis: Secondary | ICD-10-CM | POA: Diagnosis not present

## 2018-08-05 DIAGNOSIS — R2681 Unsteadiness on feet: Secondary | ICD-10-CM | POA: Diagnosis not present

## 2018-08-06 DIAGNOSIS — R2681 Unsteadiness on feet: Secondary | ICD-10-CM | POA: Diagnosis not present

## 2018-08-06 DIAGNOSIS — R278 Other lack of coordination: Secondary | ICD-10-CM | POA: Diagnosis not present

## 2018-08-06 DIAGNOSIS — R2689 Other abnormalities of gait and mobility: Secondary | ICD-10-CM | POA: Diagnosis not present

## 2018-08-06 DIAGNOSIS — M6281 Muscle weakness (generalized): Secondary | ICD-10-CM | POA: Diagnosis not present

## 2018-08-06 DIAGNOSIS — M158 Other polyosteoarthritis: Secondary | ICD-10-CM | POA: Diagnosis not present

## 2018-08-06 DIAGNOSIS — Z96642 Presence of left artificial hip joint: Secondary | ICD-10-CM | POA: Diagnosis not present

## 2018-08-09 DIAGNOSIS — R2681 Unsteadiness on feet: Secondary | ICD-10-CM | POA: Diagnosis not present

## 2018-08-09 DIAGNOSIS — Z96642 Presence of left artificial hip joint: Secondary | ICD-10-CM | POA: Diagnosis not present

## 2018-08-09 DIAGNOSIS — R2689 Other abnormalities of gait and mobility: Secondary | ICD-10-CM | POA: Diagnosis not present

## 2018-08-09 DIAGNOSIS — R278 Other lack of coordination: Secondary | ICD-10-CM | POA: Diagnosis not present

## 2018-08-09 DIAGNOSIS — M6281 Muscle weakness (generalized): Secondary | ICD-10-CM | POA: Diagnosis not present

## 2018-08-09 DIAGNOSIS — M158 Other polyosteoarthritis: Secondary | ICD-10-CM | POA: Diagnosis not present

## 2018-08-10 DIAGNOSIS — R2681 Unsteadiness on feet: Secondary | ICD-10-CM | POA: Diagnosis not present

## 2018-08-10 DIAGNOSIS — R278 Other lack of coordination: Secondary | ICD-10-CM | POA: Diagnosis not present

## 2018-08-10 DIAGNOSIS — Z96642 Presence of left artificial hip joint: Secondary | ICD-10-CM | POA: Diagnosis not present

## 2018-08-10 DIAGNOSIS — M6281 Muscle weakness (generalized): Secondary | ICD-10-CM | POA: Diagnosis not present

## 2018-08-10 DIAGNOSIS — M158 Other polyosteoarthritis: Secondary | ICD-10-CM | POA: Diagnosis not present

## 2018-08-10 DIAGNOSIS — R2689 Other abnormalities of gait and mobility: Secondary | ICD-10-CM | POA: Diagnosis not present

## 2018-08-11 DIAGNOSIS — M6281 Muscle weakness (generalized): Secondary | ICD-10-CM | POA: Diagnosis not present

## 2018-08-11 DIAGNOSIS — R2689 Other abnormalities of gait and mobility: Secondary | ICD-10-CM | POA: Diagnosis not present

## 2018-08-11 DIAGNOSIS — R278 Other lack of coordination: Secondary | ICD-10-CM | POA: Diagnosis not present

## 2018-08-11 DIAGNOSIS — Z96642 Presence of left artificial hip joint: Secondary | ICD-10-CM | POA: Diagnosis not present

## 2018-08-11 DIAGNOSIS — R2681 Unsteadiness on feet: Secondary | ICD-10-CM | POA: Diagnosis not present

## 2018-08-11 DIAGNOSIS — M158 Other polyosteoarthritis: Secondary | ICD-10-CM | POA: Diagnosis not present

## 2018-08-12 DIAGNOSIS — R278 Other lack of coordination: Secondary | ICD-10-CM | POA: Diagnosis not present

## 2018-08-12 DIAGNOSIS — M158 Other polyosteoarthritis: Secondary | ICD-10-CM | POA: Diagnosis not present

## 2018-08-12 DIAGNOSIS — R2681 Unsteadiness on feet: Secondary | ICD-10-CM | POA: Diagnosis not present

## 2018-08-12 DIAGNOSIS — R2689 Other abnormalities of gait and mobility: Secondary | ICD-10-CM | POA: Diagnosis not present

## 2018-08-12 DIAGNOSIS — Z96642 Presence of left artificial hip joint: Secondary | ICD-10-CM | POA: Diagnosis not present

## 2018-08-12 DIAGNOSIS — M6281 Muscle weakness (generalized): Secondary | ICD-10-CM | POA: Diagnosis not present

## 2018-08-13 DIAGNOSIS — R278 Other lack of coordination: Secondary | ICD-10-CM | POA: Diagnosis not present

## 2018-08-13 DIAGNOSIS — Z96642 Presence of left artificial hip joint: Secondary | ICD-10-CM | POA: Diagnosis not present

## 2018-08-13 DIAGNOSIS — R2689 Other abnormalities of gait and mobility: Secondary | ICD-10-CM | POA: Diagnosis not present

## 2018-08-13 DIAGNOSIS — M158 Other polyosteoarthritis: Secondary | ICD-10-CM | POA: Diagnosis not present

## 2018-08-13 DIAGNOSIS — R2681 Unsteadiness on feet: Secondary | ICD-10-CM | POA: Diagnosis not present

## 2018-08-13 DIAGNOSIS — M6281 Muscle weakness (generalized): Secondary | ICD-10-CM | POA: Diagnosis not present

## 2018-08-16 DIAGNOSIS — M6281 Muscle weakness (generalized): Secondary | ICD-10-CM | POA: Diagnosis not present

## 2018-08-16 DIAGNOSIS — R2681 Unsteadiness on feet: Secondary | ICD-10-CM | POA: Diagnosis not present

## 2018-08-16 DIAGNOSIS — R278 Other lack of coordination: Secondary | ICD-10-CM | POA: Diagnosis not present

## 2018-08-16 DIAGNOSIS — Z96642 Presence of left artificial hip joint: Secondary | ICD-10-CM | POA: Diagnosis not present

## 2018-08-16 DIAGNOSIS — M158 Other polyosteoarthritis: Secondary | ICD-10-CM | POA: Diagnosis not present

## 2018-08-16 DIAGNOSIS — R2689 Other abnormalities of gait and mobility: Secondary | ICD-10-CM | POA: Diagnosis not present

## 2018-08-17 DIAGNOSIS — M158 Other polyosteoarthritis: Secondary | ICD-10-CM | POA: Diagnosis not present

## 2018-08-17 DIAGNOSIS — R2681 Unsteadiness on feet: Secondary | ICD-10-CM | POA: Diagnosis not present

## 2018-08-17 DIAGNOSIS — R278 Other lack of coordination: Secondary | ICD-10-CM | POA: Diagnosis not present

## 2018-08-17 DIAGNOSIS — R2689 Other abnormalities of gait and mobility: Secondary | ICD-10-CM | POA: Diagnosis not present

## 2018-08-17 DIAGNOSIS — M6281 Muscle weakness (generalized): Secondary | ICD-10-CM | POA: Diagnosis not present

## 2018-08-17 DIAGNOSIS — Z96642 Presence of left artificial hip joint: Secondary | ICD-10-CM | POA: Diagnosis not present

## 2018-08-18 DIAGNOSIS — R2681 Unsteadiness on feet: Secondary | ICD-10-CM | POA: Diagnosis not present

## 2018-08-18 DIAGNOSIS — M6281 Muscle weakness (generalized): Secondary | ICD-10-CM | POA: Diagnosis not present

## 2018-08-18 DIAGNOSIS — Z96642 Presence of left artificial hip joint: Secondary | ICD-10-CM | POA: Diagnosis not present

## 2018-08-18 DIAGNOSIS — M158 Other polyosteoarthritis: Secondary | ICD-10-CM | POA: Diagnosis not present

## 2018-08-18 DIAGNOSIS — R2689 Other abnormalities of gait and mobility: Secondary | ICD-10-CM | POA: Diagnosis not present

## 2018-08-18 DIAGNOSIS — R278 Other lack of coordination: Secondary | ICD-10-CM | POA: Diagnosis not present

## 2018-08-19 DIAGNOSIS — R2681 Unsteadiness on feet: Secondary | ICD-10-CM | POA: Diagnosis not present

## 2018-08-19 DIAGNOSIS — R2689 Other abnormalities of gait and mobility: Secondary | ICD-10-CM | POA: Diagnosis not present

## 2018-08-19 DIAGNOSIS — M158 Other polyosteoarthritis: Secondary | ICD-10-CM | POA: Diagnosis not present

## 2018-08-19 DIAGNOSIS — Z96642 Presence of left artificial hip joint: Secondary | ICD-10-CM | POA: Diagnosis not present

## 2018-08-19 DIAGNOSIS — R278 Other lack of coordination: Secondary | ICD-10-CM | POA: Diagnosis not present

## 2018-08-19 DIAGNOSIS — M6281 Muscle weakness (generalized): Secondary | ICD-10-CM | POA: Diagnosis not present

## 2018-08-23 DIAGNOSIS — M6281 Muscle weakness (generalized): Secondary | ICD-10-CM | POA: Diagnosis not present

## 2018-08-23 DIAGNOSIS — M158 Other polyosteoarthritis: Secondary | ICD-10-CM | POA: Diagnosis not present

## 2018-08-23 DIAGNOSIS — R278 Other lack of coordination: Secondary | ICD-10-CM | POA: Diagnosis not present

## 2018-08-23 DIAGNOSIS — R2681 Unsteadiness on feet: Secondary | ICD-10-CM | POA: Diagnosis not present

## 2018-08-23 DIAGNOSIS — R2689 Other abnormalities of gait and mobility: Secondary | ICD-10-CM | POA: Diagnosis not present

## 2018-08-23 DIAGNOSIS — Z96642 Presence of left artificial hip joint: Secondary | ICD-10-CM | POA: Diagnosis not present

## 2018-08-24 DIAGNOSIS — M6281 Muscle weakness (generalized): Secondary | ICD-10-CM | POA: Diagnosis not present

## 2018-08-24 DIAGNOSIS — M158 Other polyosteoarthritis: Secondary | ICD-10-CM | POA: Diagnosis not present

## 2018-08-24 DIAGNOSIS — R278 Other lack of coordination: Secondary | ICD-10-CM | POA: Diagnosis not present

## 2018-08-24 DIAGNOSIS — R2681 Unsteadiness on feet: Secondary | ICD-10-CM | POA: Diagnosis not present

## 2018-08-24 DIAGNOSIS — Z96642 Presence of left artificial hip joint: Secondary | ICD-10-CM | POA: Diagnosis not present

## 2018-08-24 DIAGNOSIS — R2689 Other abnormalities of gait and mobility: Secondary | ICD-10-CM | POA: Diagnosis not present

## 2018-08-31 DIAGNOSIS — Z96652 Presence of left artificial knee joint: Secondary | ICD-10-CM | POA: Diagnosis not present

## 2018-08-31 DIAGNOSIS — M1711 Unilateral primary osteoarthritis, right knee: Secondary | ICD-10-CM | POA: Diagnosis not present

## 2018-08-31 DIAGNOSIS — Z96642 Presence of left artificial hip joint: Secondary | ICD-10-CM | POA: Diagnosis not present

## 2018-10-14 DIAGNOSIS — B372 Candidiasis of skin and nail: Secondary | ICD-10-CM | POA: Diagnosis not present

## 2018-11-15 ENCOUNTER — Other Ambulatory Visit: Payer: Self-pay | Admitting: Gastroenterology

## 2018-11-15 DIAGNOSIS — Z86010 Personal history of colonic polyps: Secondary | ICD-10-CM

## 2018-11-25 ENCOUNTER — Ambulatory Visit
Admission: RE | Admit: 2018-11-25 | Discharge: 2018-11-25 | Disposition: A | Discharge status: Home / Self Care | Payer: Medicare Other | Source: Ambulatory Visit | Attending: Gastroenterology | Admitting: Gastroenterology

## 2018-11-25 DIAGNOSIS — Z86010 Personal history of colonic polyps: Secondary | ICD-10-CM

## 2018-11-25 DIAGNOSIS — K769 Liver disease, unspecified: Secondary | ICD-10-CM | POA: Diagnosis not present

## 2019-02-18 ENCOUNTER — Other Ambulatory Visit: Payer: Self-pay | Admitting: Family Medicine

## 2019-02-18 DIAGNOSIS — Z1231 Encounter for screening mammogram for malignant neoplasm of breast: Secondary | ICD-10-CM

## 2019-04-07 ENCOUNTER — Ambulatory Visit
Admission: RE | Admit: 2019-04-07 | Discharge: 2019-04-07 | Disposition: A | Discharge status: Home / Self Care | Payer: Medicare Other | Source: Ambulatory Visit | Attending: Family Medicine | Admitting: Family Medicine

## 2019-04-07 ENCOUNTER — Other Ambulatory Visit: Payer: Self-pay

## 2019-04-07 DIAGNOSIS — Z1231 Encounter for screening mammogram for malignant neoplasm of breast: Secondary | ICD-10-CM | POA: Diagnosis not present

## 2019-06-15 DIAGNOSIS — F5105 Insomnia due to other mental disorder: Secondary | ICD-10-CM | POA: Diagnosis not present

## 2019-06-15 DIAGNOSIS — E785 Hyperlipidemia, unspecified: Secondary | ICD-10-CM | POA: Diagnosis not present

## 2019-06-15 DIAGNOSIS — F329 Major depressive disorder, single episode, unspecified: Secondary | ICD-10-CM | POA: Diagnosis not present

## 2019-06-15 DIAGNOSIS — Z0000 Encounter for general adult medical examination without abnormal findings: Secondary | ICD-10-CM | POA: Diagnosis not present

## 2019-06-15 DIAGNOSIS — I10 Essential (primary) hypertension: Secondary | ICD-10-CM | POA: Diagnosis not present

## 2019-06-15 DIAGNOSIS — E559 Vitamin D deficiency, unspecified: Secondary | ICD-10-CM | POA: Diagnosis not present

## 2019-06-15 DIAGNOSIS — Z818 Family history of other mental and behavioral disorders: Secondary | ICD-10-CM | POA: Diagnosis not present

## 2019-06-15 DIAGNOSIS — Z1389 Encounter for screening for other disorder: Secondary | ICD-10-CM | POA: Diagnosis not present

## 2019-06-15 DIAGNOSIS — E038 Other specified hypothyroidism: Secondary | ICD-10-CM | POA: Diagnosis not present

## 2019-06-15 DIAGNOSIS — J45909 Unspecified asthma, uncomplicated: Secondary | ICD-10-CM | POA: Diagnosis not present

## 2019-06-15 DIAGNOSIS — I6990 Unspecified sequelae of unspecified cerebrovascular disease: Secondary | ICD-10-CM | POA: Diagnosis not present

## 2019-06-15 DIAGNOSIS — M150 Primary generalized (osteo)arthritis: Secondary | ICD-10-CM | POA: Diagnosis not present

## 2019-08-10 ENCOUNTER — Ambulatory Visit: Payer: Medicare Other | Admitting: Neurology

## 2019-09-12 ENCOUNTER — Ambulatory Visit (INDEPENDENT_AMBULATORY_CARE_PROVIDER_SITE_OTHER): Payer: Medicare Other | Admitting: Neurology

## 2019-09-12 ENCOUNTER — Other Ambulatory Visit: Payer: Self-pay

## 2019-09-12 ENCOUNTER — Encounter: Payer: Self-pay | Admitting: Neurology

## 2019-09-12 VITALS — BP 134/78 | HR 96 | Ht 63.0 in | Wt 188.0 lb

## 2019-09-12 DIAGNOSIS — R269 Unspecified abnormalities of gait and mobility: Secondary | ICD-10-CM

## 2019-09-12 DIAGNOSIS — R251 Tremor, unspecified: Secondary | ICD-10-CM

## 2019-09-12 NOTE — Progress Notes (Signed)
Subjective:    Patient ID: Emily Bartlett is a 79 y.o. female.  HPI     Star Age, MD, PhD [facility] [address] [place],  [address]  Dear Dr. Laurann Montana,   I saw your patient, Emily Bartlett, can you can request to my neurologic clinic today for initial consultation of her concern for parkinsonism.  The patient is unaccompanied today.  As you know, Emily Bartlett is a 79 year old right-handed woman with an underlying medical history of hypertension, hyperlipidemia, stroke, anxiety, depression, thyroid disease, vitamin D deficiency, osteoporosis, arthritis, reflux disease, and obesity, who reports an intermittent hand tremor affecting both hands. She denies any tremor at rest. She reports that she has fallen, she fell about 3 months ago while doing yard work, she toppled forward. She also reports a fear of falling, reports issues with arthritis. She had hip replacement on the left and knee replacement on the left. She has a walker but does not typically use it, did not bring it today. She reports that her father may have had Parkinson's disease and that her brother who died at 38 had Parkinson's disease. Patient is widowed. She has 2 children living, one of her fraternal twins, her son, died at age 50 from [place].  She reports occasional issues with constipation. She has intermittent numbness, particularly when sleeping at night, when she sleeps on one side, it gets numb and tingly and then she has to turn over to the other side. She does not have any permanent numbness.  I reviewed your office note from 06/15/2019. She had blood work through your office on 06/20/2019 and I reviewed the results: Cholesterol panel was benign, total cholesterol 166, triglycerides 198, LDL 70, TSH 1.02, CMP unremarkable with the exception of glucose at 117, BUN 17, creatinine 0.89. She had a brain MRI with and without contrast on 09/18/2009 and I reviewed  the results:   IMPRESSION:  1.  No evidence of acute ischemia.  2.  Nonspecific subcortical white matter changes supratentorially  as described.  Compared to the  examination of 2009  have increased  in number especially in the bifrontal regions. These probably  represent areas of ischemic gliosis related small vessel disease  due to hypertension and/or diabetes.  3.  Old lacunar infarct involving the right mid frontal  periventricular white matter.  4.  Mild inflammatory mucosal thickening of the paranasal sinuses  as described.   For secondary stroke prevention she has been on Plavix, she also takes losartan and has been on pravastatin.  Her Past Medical History Is Significant For: Past Medical History:  Diagnosis Date  . Anxiety   . CVA (cerebral infarction) 04/2007  . Depression   . Hyperlipidemia   . Hypertension   . Morton neuroma   . Osteoporosis   . Stroke ([place])   . Thyroid disease   . Vitamin D deficiency disease     Her Past Surgical History Is Significant For: Past Surgical History:  Procedure Laterality Date  . ABDOMINAL HYSTERECTOMY    . BREAST BIOPSY    . ECTOPIC PREGNANCY SURGERY    . JOINT REPLACEMENT    . TOTAL HIP ARTHROPLASTY Left 07/21/2018   Procedure: TOTAL HIP ARTHROPLASTY ANTERIOR APPROACH;  Surgeon: Lovell Sheehan, MD;  Location: [facility] ORS;  Service: Orthopedics;  Laterality: Left;    Her Family History Is Significant For: Family History  Problem Relation Age of Onset  . Breast cancer Sister  Her Social History Is Significant For: Social History   Socioeconomic History  . Marital status: Widowed    Spouse name: Not on file  . Number of children: Not on file  . Years of education: Not on file  . Highest education level: Not on file  Occupational History  . Not on file  Tobacco Use  . Smoking status: Never Smoker  . Smokeless tobacco: Never Used  Substance and Sexual Activity  . Alcohol use: Not Currently  . Drug use: Never  .  Sexual activity: Not Currently  Other Topics Concern  . Not on file  Social History Narrative  . Not on file   Social Determinants of Health   Financial Resource Strain:   . Difficulty of Paying Living Expenses:   Food Insecurity:   . Worried About Charity fundraiser in the Last Year:   . Arboriculturist in the Last Year:   Transportation Needs:   . Film/video editor (Medical):   Marland Kitchen Lack of Transportation (Non-Medical):   Physical Activity:   . Days of Exercise per Week:   . Minutes of Exercise per Session:   Stress:   . Feeling of Stress :   Social Connections:   . Frequency of Communication with Friends and Family:   . Frequency of Social Gatherings with Friends and Family:   . Attends Religious Services:   . Active Member of Clubs or Organizations:   . Attends Archivist Meetings:   Marland Kitchen Marital Status:     Her Allergies Are:  No Known Allergies:   Her Current Medications Are:  Outpatient Encounter Medications as of 09/12/2019  Medication Sig  . clopidogrel (PLAVIX) 75 MG tablet Take 1 tablet (75 mg total) by mouth daily.  Marland Kitchen docusate sodium (COLACE) 100 MG capsule Take 1 capsule (100 mg total) by mouth 2 (two) times daily.  Marland Kitchen levothyroxine (SYNTHROID, LEVOTHROID) 100 MCG tablet Take 1 tablet (100 mcg total) by mouth daily.  Marland Kitchen losartan (COZAAR) 100 MG tablet Take 0.5 tablets (50 mg total) by mouth daily. (Patient taking differently: Take 100 mg by mouth daily. )  . meloxicam (MOBIC) 7.5 MG tablet Take 1 tablet (7.5 mg total) by mouth daily.  . polyethylene glycol (MIRALAX / GLYCOLAX) packet Take 17 g by mouth daily as needed. (Patient taking differently: Take 17 g by mouth daily as needed for moderate constipation. )  . pravastatin (PRAVACHOL) 20 MG tablet Take 1 tablet (20 mg total) by mouth daily.  . traMADol (ULTRAM) 50 MG tablet Take 50 mg by mouth every 6 (six) hours as needed (pain).   Marland Kitchen zolpidem (AMBIEN) 10 MG tablet Take 1 tablet (10 mg total) by mouth  at bedtime as needed. (Patient taking differently: Take 5 mg by mouth at bedtime as needed for sleep. )   No facility-administered encounter medications on file as of 09/12/2019.  :   Review of Systems:  Out of a complete 14 point review of systems, all are reviewed and negative with the exception of these symptoms as listed below:  Review of Systems  Neurological:       Pt reports family hx of parkinson's and dementia.. Pt reports she wanted a neurologist opinion about prevention. Pt reports decreased sensation in her fingers and intermittent shaking, along with decreased ambulation.     Objective:  Neurological Exam  Physical Exam Physical Examination:   Vitals:   09/12/19 1408  BP: 134/78  Pulse: 96  SpO2: 96%  General Examination: The patient is a very pleasant 79 y.o. female in no acute distress. She appears well-developed and well-nourished and well groomed.   HEENT: Normocephalic, atraumatic, pupils are equal, round and reactive to light, extraocular tracking is well preserved, no gaze limitation, no obvious facial masking noted. Hearing is quite impaired, she has no hearing aids. She reports that she misplaced them. Speech is clear, no dysarthria or hypophonia, she talks rather loudly. No dysarthria. Airway examination reveals mild mouth dryness, tongue protrudes centrally and palate elevates symmetrically. No nuchal rigidity noted, no lip, neck or jaw tremor. No voice tremor.  Chest: Clear to auscultation without wheezing, rhonchi or crackles noted.  Heart: S1+S2+0, regular and normal without murmurs, rubs or gallops noted.   Abdomen: Soft, non-tender and non-distended with normal bowel sounds appreciated on auscultation.  Extremities: There is 1+ pitting edema in the distal lower extremities bilaterally. No discoloration noted.  Skin: Warm and dry without trophic changes noted. There are no obvious varicose veins.  Musculoskeletal: exam reveals unremarkable scar  left knee from replacement surgery.   Neurologically:  Mental status: The patient is awake, alert and oriented in all 4 spheres. Her immediate and remote memory, attention, language skills and fund of knowledge are appropriate. There is no evidence of aphasia, agnosia, apraxia or anomia. Speech is clear with normal prosody and enunciation. Thought process is linear. Mood is normal and affect is normal.  Cranial nerves II - XII are as described above under HEENT exam. In addition: shoulder shrug is normal with equal shoulder height noted. Motor exam: Normal bulk, strength and tone is noted. There is no drift, tremor or rebound. Reflexes are 1+ in the upper extremities and trace in the lower extremities, toes are downgoing bilaterally. Fine motor testing with finger taps and foot taps and hand movements show mild slowness generally speaking, no decrement in amplitude, no lateralization. She has no resting tremor, no significant postural or action tremor. Cerebellar testing shows no dysmetria or intention tremor, heel-to-shin is normal with the right leg, reduced range of motion noted with the left leg but no dysmetria. She has no truncal or gait ataxia. She stands without difficulty, did not bring a walking aid. She walks slightly slowly and in securely, posture is age-appropriate, no shuffling, preserved arm swing noted.  Sensory exam: intact to light touch in the upper and lower extremities.  Assessment and Plan:     In summary, Emily Bartlett is a very pleasant 79 y.o.-year old female with an underlying medical history of hypertension, hyperlipidemia, stroke, anxiety, depression, thyroid disease, vitamin D deficiency, osteoporosis, arthritis, reflux disease, and obesity, who presents for evaluation of her intermittent tremors and concern for parkinsonism. She reports that her brother who died at 99 had Parkinson's disease and she suspects that her dad had Parkinson's disease as he had hand tremors.  Her father died from lung cancer and was not definitively diagnosed with Parkinson's disease. Her history and examination are not in keeping with parkinsonism, she certainly does not have any telltale signs of Parkinson's disease. She has a nonspecific gait disturbance, likely secondary to a combination of factors including normal aging, arthritis, joint replacement surgeries, obesity, and insecurity secondary to prior fall. She is encouraged to use a cane or her walker for gait safety. She was advised that there is no definitive test for Parkinson's disease and that it is a clinical diagnosis. She is encouraged to continue with healthy lifestyle and advised to follow-up routinely with your office. She  does not need a routine follow-up appointment with me at this time. I answered all her questions to the best of my abilities. She was encouraged to seek evaluation for hearing loss and new hearing aids. She reports that she misplaced them. She also was encouraged to talk to you about her cold sensation in her feet. She may benefit from vascular evaluation.  I answered all her questions today and she was in agreement. Thank you very much for allowing me to participate in the care of this nice patient. If I can be of any further assistance to you please do not hesitate to call me at [phone].  Sincerely,   Star Age, MD, PhD

## 2019-09-12 NOTE — Patient Instructions (Signed)
As discussed, I do not see any telltale changes in your examination in keeping with Parkinson's disease or parkinsonism (Parkinson's-like changes).  You do have a somewhat insecure gait, I recommend that you use your cane at least or a walker at all times for gait safety as you report that you did fall about 3 months ago. Gait disorder can have multiple reasons including arthritis, prior falls, obesity.   Please talk to your primary care physician about your symptoms of feeling cold in your feet at all times, sometimes it is related to circulation. You can follow-up with Dr. Laurann Montana on a scheduled basis. We do not have to make a follow-up appointment for routine checkup here.

## 2019-12-15 DIAGNOSIS — E038 Other specified hypothyroidism: Secondary | ICD-10-CM | POA: Diagnosis not present

## 2019-12-15 DIAGNOSIS — F329 Major depressive disorder, single episode, unspecified: Secondary | ICD-10-CM | POA: Diagnosis not present

## 2019-12-15 DIAGNOSIS — I10 Essential (primary) hypertension: Secondary | ICD-10-CM | POA: Diagnosis not present

## 2019-12-15 DIAGNOSIS — F5105 Insomnia due to other mental disorder: Secondary | ICD-10-CM | POA: Diagnosis not present

## 2019-12-15 DIAGNOSIS — R7303 Prediabetes: Secondary | ICD-10-CM | POA: Diagnosis not present

## 2019-12-15 DIAGNOSIS — E785 Hyperlipidemia, unspecified: Secondary | ICD-10-CM | POA: Diagnosis not present

## 2019-12-15 DIAGNOSIS — I6990 Unspecified sequelae of unspecified cerebrovascular disease: Secondary | ICD-10-CM | POA: Diagnosis not present

## 2020-02-29 ENCOUNTER — Other Ambulatory Visit: Payer: Self-pay | Admitting: Family Medicine

## 2020-02-29 DIAGNOSIS — Z1231 Encounter for screening mammogram for malignant neoplasm of breast: Secondary | ICD-10-CM
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
## 2020-04-11 ENCOUNTER — Ambulatory Visit
Admission: RE | Admit: 2020-04-11 | Discharge: 2020-04-11 | Disposition: A | Discharge status: Home / Self Care | Payer: Medicare Other | Source: Ambulatory Visit | Attending: Family Medicine | Admitting: Family Medicine

## 2020-04-11 ENCOUNTER — Other Ambulatory Visit: Payer: Self-pay

## 2020-04-11 DIAGNOSIS — Z1231 Encounter for screening mammogram for malignant neoplasm of breast: Secondary | ICD-10-CM

## 2020-06-18 DIAGNOSIS — M150 Primary generalized (osteo)arthritis: Secondary | ICD-10-CM | POA: Diagnosis not present

## 2020-06-18 DIAGNOSIS — I6990 Unspecified sequelae of unspecified cerebrovascular disease: Secondary | ICD-10-CM | POA: Diagnosis not present

## 2020-06-18 DIAGNOSIS — N183 Chronic kidney disease, stage 3 unspecified: Secondary | ICD-10-CM | POA: Diagnosis not present

## 2020-06-18 DIAGNOSIS — E559 Vitamin D deficiency, unspecified: Secondary | ICD-10-CM | POA: Diagnosis not present

## 2020-06-18 DIAGNOSIS — R7303 Prediabetes: Secondary | ICD-10-CM | POA: Diagnosis not present

## 2020-06-18 DIAGNOSIS — J309 Allergic rhinitis, unspecified: Secondary | ICD-10-CM | POA: Diagnosis not present

## 2020-06-18 DIAGNOSIS — Z0000 Encounter for general adult medical examination without abnormal findings: Secondary | ICD-10-CM | POA: Diagnosis not present

## 2020-06-18 DIAGNOSIS — I129 Hypertensive chronic kidney disease with stage 1 through stage 4 chronic kidney disease, or unspecified chronic kidney disease: Secondary | ICD-10-CM | POA: Diagnosis not present

## 2020-06-18 DIAGNOSIS — F5105 Insomnia due to other mental disorder: Secondary | ICD-10-CM | POA: Diagnosis not present

## 2020-06-18 DIAGNOSIS — Z1389 Encounter for screening for other disorder: Secondary | ICD-10-CM | POA: Diagnosis not present

## 2020-06-18 DIAGNOSIS — E785 Hyperlipidemia, unspecified: Secondary | ICD-10-CM | POA: Diagnosis not present

## 2020-06-18 DIAGNOSIS — E038 Other specified hypothyroidism: Secondary | ICD-10-CM | POA: Diagnosis not present

## 2020-06-19 ENCOUNTER — Other Ambulatory Visit: Payer: Self-pay | Admitting: Family Medicine

## 2020-06-19 DIAGNOSIS — E2839 Other primary ovarian failure: Secondary | ICD-10-CM

## 2020-06-29 IMAGING — XA OPERATIVE LEFT HIP WITH PELVIS
3 series · 3 of 3 positions shown · non-contrast
Comparison: None.

CLINICAL DATA: Anterior approach left hip replacement.

EXAM:
OPERATIVE LEFT HIP (WITH PELVIS IF PERFORMED) 3 VIEWS
TECHNIQUE: Fluoroscopic spot image(s) were submitted for interpretation
post-operatively.
FLUOROSCOPY TIME:  14 seconds

[Series 1: cont. · 1 of 1 slices shown (1 of 3)]
[im 1/1]
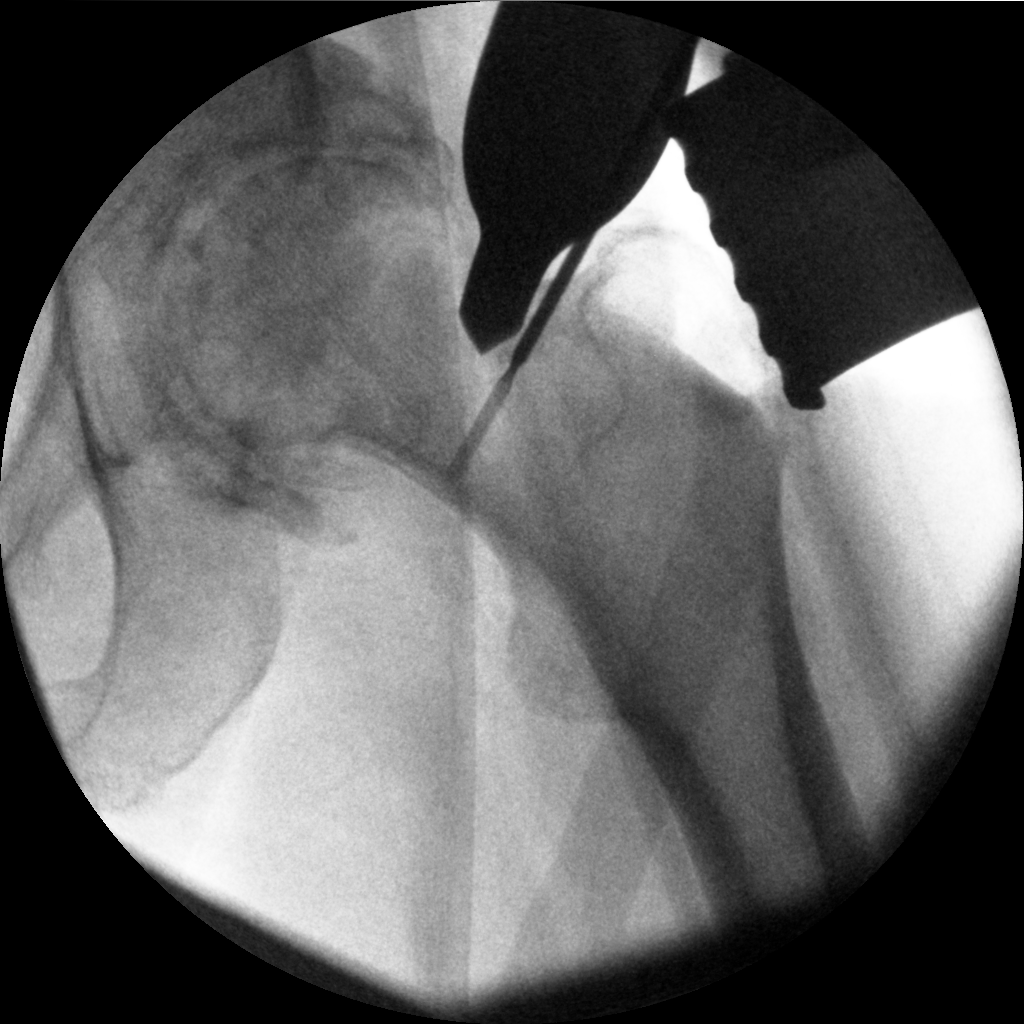

[Series 2: cont. · 1 of 1 slices shown (2 of 3)]
[im 1/1]
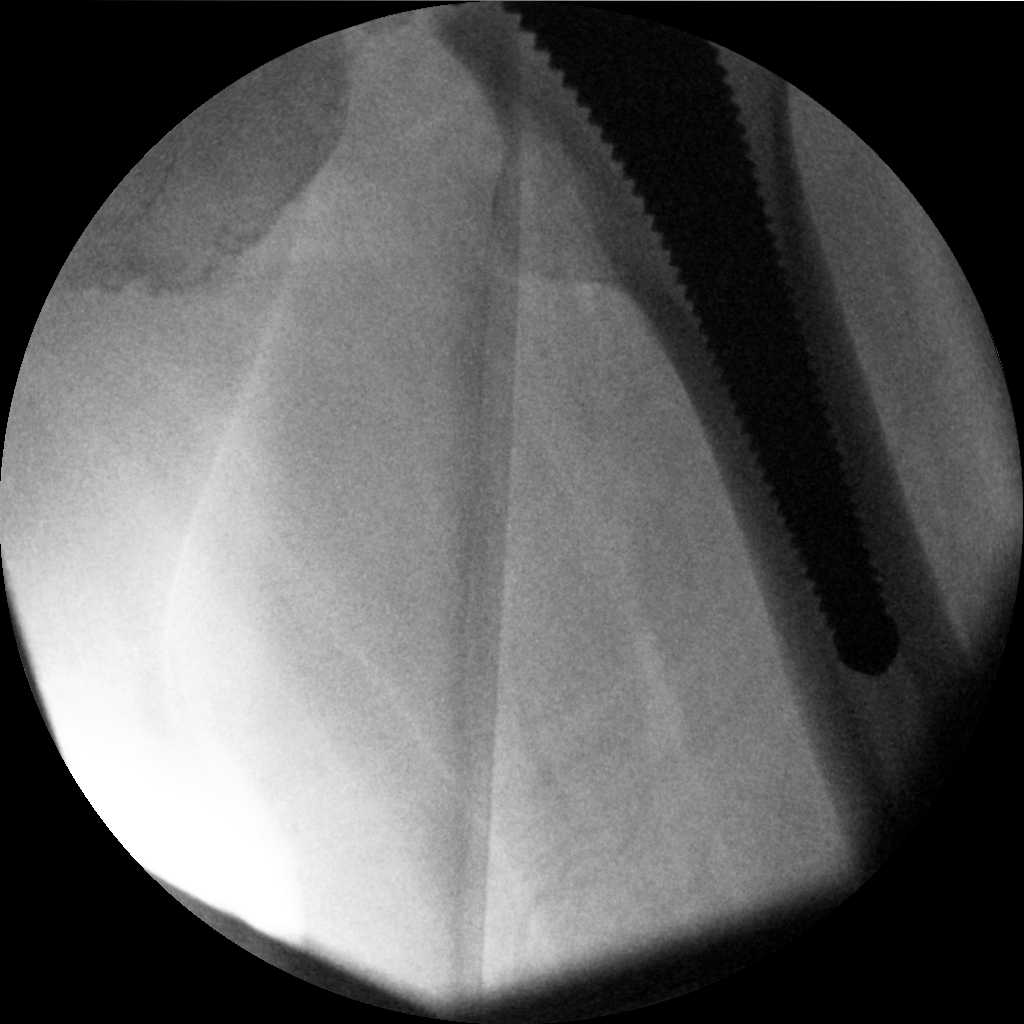

[Series 3: cont. · 1 of 1 slices shown (3 of 3)]
[im 1/1]
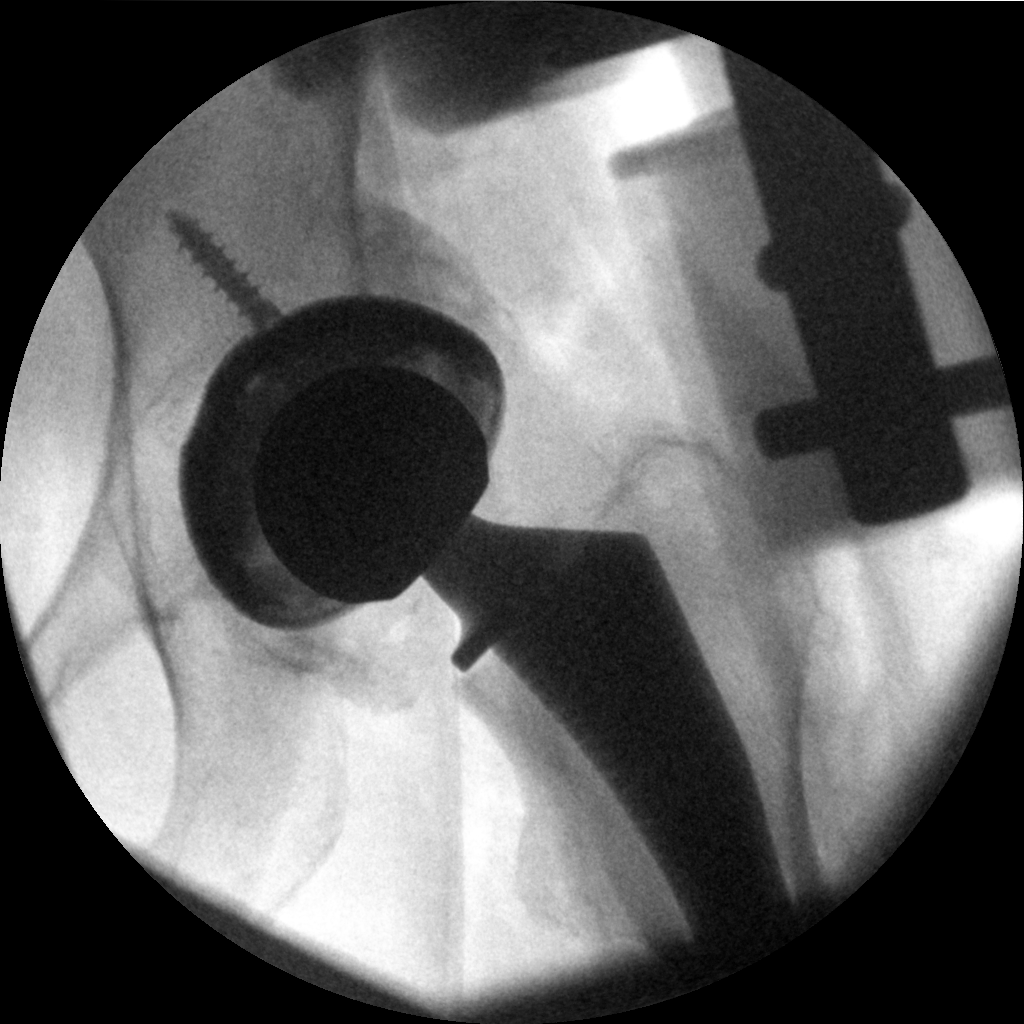

[3 of 3 positions shown; findings below may reference images not displayed]

FINDINGS: Three spot intraoperative fluoroscopic images of the left hip are
provided for review

Post left total hip replacement. Alignment appears anatomic given
projection and coned field of view. There is a minimal amount of
expected subcutaneous emphysema about the operative site. No
radiopaque foreign body.
IMPRESSION: Post left total hip replacement without evidence of complication

## 2020-12-21 ENCOUNTER — Ambulatory Visit
Admission: RE | Admit: 2020-12-21 | Discharge: 2020-12-21 | Disposition: A | Discharge status: Home / Self Care | Payer: Medicare Other | Source: Ambulatory Visit | Attending: Family Medicine | Admitting: Family Medicine

## 2020-12-21 ENCOUNTER — Other Ambulatory Visit: Payer: Self-pay | Admitting: Family Medicine

## 2020-12-21 DIAGNOSIS — R7303 Prediabetes: Secondary | ICD-10-CM | POA: Diagnosis not present

## 2020-12-21 DIAGNOSIS — R0600 Dyspnea, unspecified: Secondary | ICD-10-CM

## 2020-12-21 DIAGNOSIS — E785 Hyperlipidemia, unspecified: Secondary | ICD-10-CM | POA: Diagnosis not present

## 2020-12-21 DIAGNOSIS — F5105 Insomnia due to other mental disorder: Secondary | ICD-10-CM | POA: Diagnosis not present

## 2020-12-21 DIAGNOSIS — E038 Other specified hypothyroidism: Secondary | ICD-10-CM | POA: Diagnosis not present

## 2020-12-21 DIAGNOSIS — I6990 Unspecified sequelae of unspecified cerebrovascular disease: Secondary | ICD-10-CM | POA: Diagnosis not present

## 2020-12-21 DIAGNOSIS — E559 Vitamin D deficiency, unspecified: Secondary | ICD-10-CM | POA: Diagnosis not present

## 2020-12-21 DIAGNOSIS — N183 Chronic kidney disease, stage 3 unspecified: Secondary | ICD-10-CM | POA: Diagnosis not present

## 2020-12-21 DIAGNOSIS — M150 Primary generalized (osteo)arthritis: Secondary | ICD-10-CM | POA: Diagnosis not present

## 2020-12-21 DIAGNOSIS — R0609 Other forms of dyspnea: Secondary | ICD-10-CM | POA: Diagnosis not present

## 2020-12-21 DIAGNOSIS — R300 Dysuria: Secondary | ICD-10-CM | POA: Diagnosis not present

## 2020-12-21 DIAGNOSIS — I129 Hypertensive chronic kidney disease with stage 1 through stage 4 chronic kidney disease, or unspecified chronic kidney disease: Secondary | ICD-10-CM | POA: Diagnosis not present

## 2021-01-02 DIAGNOSIS — R0609 Other forms of dyspnea: Secondary | ICD-10-CM | POA: Diagnosis not present

## 2021-01-02 DIAGNOSIS — R0600 Dyspnea, unspecified: Secondary | ICD-10-CM | POA: Diagnosis not present

## 2021-02-04 ENCOUNTER — Other Ambulatory Visit: Payer: Self-pay

## 2021-02-04 ENCOUNTER — Ambulatory Visit (INDEPENDENT_AMBULATORY_CARE_PROVIDER_SITE_OTHER): Payer: Medicare Other | Admitting: Pulmonary Disease

## 2021-02-04 ENCOUNTER — Encounter: Payer: Self-pay | Admitting: Pulmonary Disease

## 2021-02-04 VITALS — BP 136/76 | HR 85 | Temp 98.5°F | Ht 63.0 in | Wt 195.2 lb

## 2021-02-04 DIAGNOSIS — R0602 Shortness of breath: Secondary | ICD-10-CM

## 2021-02-04 DIAGNOSIS — J310 Chronic rhinitis: Secondary | ICD-10-CM | POA: Diagnosis not present

## 2021-02-04 MED ORDER — FLUTICASONE PROPIONATE 50 MCG/ACT NA SUSP: 1.0000 | Freq: Every day | NASAL | 2 refills | Status: AC

## 2021-02-04 MED ORDER — FLUTICASONE PROPIONATE HFA 110 MCG/ACT IN AERO: 2.0000 | INHALATION_SPRAY | Freq: Two times a day (BID) | RESPIRATORY_TRACT | 12 refills | Status: AC

## 2021-02-04 NOTE — Patient Instructions (Addendum)
Start flovent 2 puffs twice daily - rinse mouth out after each use  Start flonase nasal spray, 1 spray per nostril daily  Work on walking more throughout the week to work on your conditioning but also to work on weight loss.   Follow up in 6 weeks for pulmonary function tests.

## 2021-02-04 NOTE — Progress Notes (Signed)
Synopsis: Referred in December 2022 for shortness of breath by Kelton Pillar, MD  Subjective:   PATIENT ID: Emily Bartlett GENDER: female DOB: Jun 01, 1940, MRN: [id number]   HPI  Chief Complaint  Patient presents with   Pulmonary Consult    Referred by Dr Kelton Pillar. Pt c/o DOE x 6 months. She states has hx of Bronchitis and PNA. She states she gets winded walking approx 200 yards. She c/o constant rhinitis.    Emily Bartlett is a 80 year old woman, never smoker with hypertension, CVA and hyperlipidemia who is referred to pulmonary clinic for shortness of breath.   She reports having progressive exertional shortness of breath over the last few months.  She is also noticed some intermittent wheezing with the shortness of breath.  She denies any cough or sputum production.  She complains of persistent runny nose in the morning times.  She does not have any prior history of asthma or reactive airways disease.  She also denies any significant history of seasonal allergies but reports that she may be developing [place] allergy.  She has gained 15 to 20 pounds over the last 2 years.  She is also much less active than since the pandemic.  She previously walked many miles for exercise and is now getting short of breath walking in her driveway.  Recent echo on 01/02/21 shows hyperdynamic LV systolic function with EF greater than 70%.  Left ventricle cavity is normal in size.  Moderate left ventricular hypertrophy.  No valvular abnormalities.  Chest x-ray from 12/21/2020 is unremarkable.  She has history of GERD and was previously on PPI therapy.  She has intermittent issues with GERD but denies any increased wheezing or shortness of breath with flares of GERD.  She is a never smoker but reports significant secondhand smoke exposure from her parents until she was 13 years old.  She has a retired travel Horticulturist, commercial. She currently lives alone.  Her parents both died from lung cancer as  they were heavy smokers.  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    Osteoporosis    Stroke [facility])    Thyroid disease    Vitamin D deficiency disease      Family History  Problem Relation Age of Onset   Lung cancer Mother    Lung cancer Father    Breast cancer Sister      Social History   Socioeconomic History   Marital status: Widowed    Spouse name: Not on file   Number of children: Not on file   Years of education: Not on file   Highest education level: Not on file  Occupational History   Not on file  Tobacco Use   Smoking status: Never   Smokeless tobacco: Never  Vaping Use   Vaping Use: Never used  Substance and Sexual Activity   Alcohol use: Not Currently   Drug use: Never   Sexual activity: Not Currently  Other Topics Concern   Not on file  Social History Narrative   Not on file   Social Determinants of Health   Financial Resource Strain: Not on file  Food Insecurity: Not on file  Transportation Needs: Not on file  Physical Activity: Not on file  Stress: Not on file  Social Connections: Not on file  Intimate Partner Violence: Not on file  No Known Allergies   Outpatient Medications Prior to Visit  Medication Sig Dispense Refill   clopidogrel (PLAVIX) 75 MG tablet Take 1 tablet (75 mg total) by mouth daily.     levothyroxine (SYNTHROID, LEVOTHROID) 100 MCG tablet Take 1 tablet (100 mcg total) by mouth daily.     losartan (COZAAR) 100 MG tablet Take 0.5 tablets (50 mg total) by mouth daily. (Patient taking differently: Take 100 mg by mouth daily.)     docusate sodium (COLACE) 100 MG capsule Take 1 capsule (100 mg total) by mouth 2 (two) times daily. 30 capsule 0   meloxicam (MOBIC) 7.5 MG tablet Take 1 tablet (7.5 mg total) by mouth daily.     polyethylene glycol (MIRALAX / GLYCOLAX) packet Take 17 g by mouth daily as needed. (Patient  taking differently: Take 17 g by mouth daily as needed for moderate constipation.) 14 each 0   pravastatin (PRAVACHOL) 20 MG tablet Take 1 tablet (20 mg total) by mouth daily.     traMADol (ULTRAM) 50 MG tablet Take 50 mg by mouth every 6 (six) hours as needed (pain).      zolpidem (AMBIEN) 10 MG tablet Take 1 tablet (10 mg total) by mouth at bedtime as needed. (Patient taking differently: Take 5 mg by mouth at bedtime as needed for sleep.) 30 tablet 0   No facility-administered medications prior to visit.   Review of Systems  Constitutional:  Negative for chills, fever, malaise/fatigue and weight loss.  HENT:  Negative for congestion, sinus pain and sore throat.   Eyes: Negative.   Respiratory:  Positive for shortness of breath and wheezing. Negative for cough, hemoptysis and sputum production.   Cardiovascular:  Negative for chest pain, palpitations, orthopnea, claudication and leg swelling.  Gastrointestinal:  Positive for heartburn. Negative for abdominal pain, nausea and vomiting.  Genitourinary: Negative.   Musculoskeletal:  Positive for joint pain. Negative for myalgias.  Skin:  Negative for rash.  Neurological:  Negative for weakness.  Endo/Heme/Allergies: Negative.   Psychiatric/Behavioral:  Positive for depression. The patient is nervous/anxious.    Objective:   Vitals:   02/04/21 1351  BP: 136/76  Pulse: 85  Temp: 98.5 F (36.9 C)  TempSrc: Oral  SpO2: 96%  Weight: 195 lb 3.2 oz (88.5 kg)  Height: 5\' 3"  (1.6 m)   Physical Exam Constitutional:      General: She is not in acute distress.    Appearance: She is not ill-appearing.  HENT:     Head: Normocephalic and atraumatic.  Eyes:     General: No scleral icterus.    Conjunctiva/sclera: Conjunctivae normal.     Pupils: Pupils are equal, round, and reactive to light.  Cardiovascular:     Rate and Rhythm: Normal rate and regular rhythm.     Pulses: Normal pulses.     Heart sounds: Normal heart sounds. No murmur  heard. Pulmonary:     Effort: Pulmonary effort is normal.     Breath sounds: Normal breath sounds. No wheezing, rhonchi or rales.  Abdominal:     General: Bowel sounds are normal.     Palpations: Abdomen is soft.  Musculoskeletal:     Right lower leg: No edema.     Left lower leg: No edema.  Lymphadenopathy:     Cervical: No cervical adenopathy.  Skin:    General: Skin is warm and dry.  Neurological:     General: No focal deficit present.     Mental Status: She is alert.  Psychiatric:        Mood and Affect: Mood normal.        Behavior: Behavior normal.        Thought Content: Thought content normal.        Judgment: Judgment normal.    CBC    Component Value Date/Time   WBC 12.5 (H) 07/23/2018 0432   RBC 3.54 (L) 07/23/2018 0432   HGB 10.9 (L) 07/23/2018 0432   HCT 32.6 (L) 07/23/2018 0432   PLT 218 07/23/2018 0432   MCV 92.1 07/23/2018 0432   MCH 30.8 07/23/2018 0432   MCHC 33.4 07/23/2018 0432   RDW 13.3 07/23/2018 0432   LYMPHSABS 1.7 04/05/2018 1804   MONOABS 0.7 04/05/2018 1804   EOSABS 0.1 04/05/2018 1804   BASOSABS 0.0 04/05/2018 1804   BMP Latest Ref Rng & Units 07/22/2018 05/18/2018 04/07/2018  Glucose 70 - 99 mg/dL 118(H) 102(H) 140(H)  BUN 8 - 23 mg/dL 18 23 19   Creatinine 0.44 - 1.00 mg/dL 0.81 0.87 0.99  Sodium 135 - 145 mmol/L 134(L) 139 141  Potassium 3.5 - 5.1 mmol/L 4.0 4.1 4.2  Chloride 98 - 111 mmol/L 100 103 107  CO2 22 - 32 mmol/L 24 26 24   Calcium 8.9 - 10.3 mg/dL 8.3(L) 9.6 9.2   Chest imaging: CXR 12/21/20 The heart size and mediastinal contours are within normal limits. Both lungs are clear. The visualized skeletal structures are unremarkable.  PFT: No flowsheet data found.  Labs:  Path:  Echo:  Heart Catheterization:  Assessment & Plan:   Shortness of breath - Plan: Pulmonary Function Test  Rhinitis, unspecified type - Plan: fluticasone (FLONASE) 50 MCG/ACT nasal spray, fluticasone (FLOVENT HFA) 110 MCG/ACT  inhaler  Discussion: Emily Bartlett is a 80 year old woman, never smoker with hypertension, CVA and hyperlipidemia who is referred to pulmonary clinic for shortness of breath.   Her progressive shortness of breath is possibly due to reactive airways disease or asthma given her intermittent wheezing.  There is also concern for significant deconditioning as she has been much more inactive over recent years and has gained 15 to 20 pounds.  We will start her on Flovent 110 MCG 2 puffs twice daily for the shortness of breath and concern for reactive airways disease.  We will monitor for any symptom improvement.  We will check pulmonary function testing at follow-up.  Her chest imaging is unremarkable at this time.  For her rhinitis she is to use fluticasone 1 spray per nostril daily.  I have recommended that she increase her physical activity via walking.  Follow-up in 6 weeks.  Emily Jackson, MD [facility] Office: [phone]    Current Outpatient Medications:    clopidogrel (PLAVIX) 75 MG tablet, Take 1 tablet (75 mg total) by mouth daily., Disp: , Rfl:    fluticasone (FLONASE) 50 MCG/ACT nasal spray, Place 1 spray into both nostrils daily., Disp: 16 g, Rfl: 2   fluticasone (FLOVENT HFA) 110 MCG/ACT inhaler, Inhale 2 puffs into the lungs 2 (two) times daily., Disp: 1 each, Rfl: 12   levothyroxine (SYNTHROID, LEVOTHROID) 100 MCG tablet, Take 1 tablet (100 mcg total) by mouth daily., Disp: , Rfl:    losartan (COZAAR) 100 MG tablet, Take 0.5 tablets (50 mg total) by mouth daily. (Patient taking differently: Take 100 mg by mouth daily.), Disp: , Rfl:

## 2021-02-14 DIAGNOSIS — Z1211 Encounter for screening for malignant neoplasm of colon: Secondary | ICD-10-CM | POA: Diagnosis not present

## 2021-02-14 DIAGNOSIS — N183 Chronic kidney disease, stage 3 unspecified: Secondary | ICD-10-CM | POA: Diagnosis not present

## 2021-02-14 DIAGNOSIS — E559 Vitamin D deficiency, unspecified: Secondary | ICD-10-CM | POA: Diagnosis not present

## 2021-02-14 DIAGNOSIS — E1122 Type 2 diabetes mellitus with diabetic chronic kidney disease: Secondary | ICD-10-CM | POA: Diagnosis not present

## 2021-02-14 DIAGNOSIS — K5900 Constipation, unspecified: Secondary | ICD-10-CM | POA: Diagnosis not present

## 2021-02-14 DIAGNOSIS — K219 Gastro-esophageal reflux disease without esophagitis: Secondary | ICD-10-CM | POA: Diagnosis not present

## 2021-03-01 ENCOUNTER — Other Ambulatory Visit: Payer: Self-pay | Admitting: Family Medicine

## 2021-03-01 DIAGNOSIS — E2839 Other primary ovarian failure: Secondary | ICD-10-CM

## 2021-03-01 DIAGNOSIS — Z1231 Encounter for screening mammogram for malignant neoplasm of breast: Secondary | ICD-10-CM

## 2021-03-27 ENCOUNTER — Ambulatory Visit: Payer: Medicare Other | Admitting: Pulmonary Disease

## 2021-04-12 ENCOUNTER — Ambulatory Visit
Admission: RE | Admit: 2021-04-12 | Discharge: 2021-04-12 | Disposition: A | Discharge status: Home / Self Care | Payer: Medicare Other | Source: Ambulatory Visit | Attending: Family Medicine | Admitting: Family Medicine

## 2021-04-12 DIAGNOSIS — Z1231 Encounter for screening mammogram for malignant neoplasm of breast: Secondary | ICD-10-CM | POA: Diagnosis not present

## 2021-04-17 ENCOUNTER — Ambulatory Visit: Payer: Medicare Other | Admitting: Pulmonary Disease

## 2021-05-13 DIAGNOSIS — Z20822 Contact with and (suspected) exposure to covid-19: Secondary | ICD-10-CM | POA: Diagnosis not present

## 2021-06-20 DIAGNOSIS — E1122 Type 2 diabetes mellitus with diabetic chronic kidney disease: Secondary | ICD-10-CM | POA: Diagnosis not present

## 2021-06-20 DIAGNOSIS — E038 Other specified hypothyroidism: Secondary | ICD-10-CM | POA: Diagnosis not present

## 2021-06-20 DIAGNOSIS — Z86010 Personal history of colonic polyps: Secondary | ICD-10-CM | POA: Diagnosis not present

## 2021-06-20 DIAGNOSIS — Z0000 Encounter for general adult medical examination without abnormal findings: Secondary | ICD-10-CM | POA: Diagnosis not present

## 2021-06-20 DIAGNOSIS — J309 Allergic rhinitis, unspecified: Secondary | ICD-10-CM | POA: Diagnosis not present

## 2021-06-20 DIAGNOSIS — N183 Chronic kidney disease, stage 3 unspecified: Secondary | ICD-10-CM | POA: Diagnosis not present

## 2021-06-20 DIAGNOSIS — E559 Vitamin D deficiency, unspecified: Secondary | ICD-10-CM | POA: Diagnosis not present

## 2021-06-20 DIAGNOSIS — E785 Hyperlipidemia, unspecified: Secondary | ICD-10-CM | POA: Diagnosis not present

## 2021-06-20 DIAGNOSIS — F329 Major depressive disorder, single episode, unspecified: Secondary | ICD-10-CM | POA: Diagnosis not present

## 2021-06-20 DIAGNOSIS — I6990 Unspecified sequelae of unspecified cerebrovascular disease: Secondary | ICD-10-CM | POA: Diagnosis not present

## 2021-06-20 DIAGNOSIS — I129 Hypertensive chronic kidney disease with stage 1 through stage 4 chronic kidney disease, or unspecified chronic kidney disease: Secondary | ICD-10-CM | POA: Diagnosis not present

## 2021-06-28 DIAGNOSIS — Z20822 Contact with and (suspected) exposure to covid-19: Secondary | ICD-10-CM | POA: Diagnosis not present

## 2021-07-30 ENCOUNTER — Ambulatory Visit
Admission: RE | Admit: 2021-07-30 | Discharge: 2021-07-30 | Disposition: A | Discharge status: Home / Self Care | Payer: Medicare Other | Source: Ambulatory Visit | Attending: Family Medicine | Admitting: Family Medicine

## 2021-07-30 DIAGNOSIS — E2839 Other primary ovarian failure: Secondary | ICD-10-CM

## 2021-07-30 DIAGNOSIS — Z780 Asymptomatic menopausal state: Secondary | ICD-10-CM | POA: Diagnosis not present

## 2021-12-20 DIAGNOSIS — N183 Chronic kidney disease, stage 3 unspecified: Secondary | ICD-10-CM | POA: Diagnosis not present

## 2021-12-20 DIAGNOSIS — E1122 Type 2 diabetes mellitus with diabetic chronic kidney disease: Secondary | ICD-10-CM | POA: Diagnosis not present

## 2021-12-20 DIAGNOSIS — Z6836 Body mass index (BMI) 36.0-36.9, adult: Secondary | ICD-10-CM | POA: Diagnosis not present

## 2021-12-20 DIAGNOSIS — E038 Other specified hypothyroidism: Secondary | ICD-10-CM | POA: Diagnosis not present

## 2021-12-20 DIAGNOSIS — M150 Primary generalized (osteo)arthritis: Secondary | ICD-10-CM | POA: Diagnosis not present

## 2021-12-20 DIAGNOSIS — E785 Hyperlipidemia, unspecified: Secondary | ICD-10-CM | POA: Diagnosis not present

## 2021-12-20 DIAGNOSIS — I129 Hypertensive chronic kidney disease with stage 1 through stage 4 chronic kidney disease, or unspecified chronic kidney disease: Secondary | ICD-10-CM | POA: Diagnosis not present

## 2021-12-20 DIAGNOSIS — I6990 Unspecified sequelae of unspecified cerebrovascular disease: Secondary | ICD-10-CM | POA: Diagnosis not present

## 2022-03-05 ENCOUNTER — Other Ambulatory Visit: Payer: Self-pay | Admitting: Internal Medicine

## 2022-03-05 DIAGNOSIS — Z1231 Encounter for screening mammogram for malignant neoplasm of breast: Secondary | ICD-10-CM

## 2022-04-25 ENCOUNTER — Ambulatory Visit
Admission: RE | Admit: 2022-04-25 | Discharge: 2022-04-25 | Disposition: A | Discharge status: Home / Self Care | Payer: Medicare Other | Source: Ambulatory Visit | Attending: Internal Medicine | Admitting: Internal Medicine

## 2022-04-25 DIAGNOSIS — Z1231 Encounter for screening mammogram for malignant neoplasm of breast: Secondary | ICD-10-CM

## 2022-06-16 DIAGNOSIS — E038 Other specified hypothyroidism: Secondary | ICD-10-CM | POA: Diagnosis not present

## 2022-06-24 DIAGNOSIS — G629 Polyneuropathy, unspecified: Secondary | ICD-10-CM | POA: Diagnosis not present

## 2022-06-24 DIAGNOSIS — K219 Gastro-esophageal reflux disease without esophagitis: Secondary | ICD-10-CM | POA: Diagnosis not present

## 2022-06-24 DIAGNOSIS — F329 Major depressive disorder, single episode, unspecified: Secondary | ICD-10-CM | POA: Diagnosis not present

## 2022-06-24 DIAGNOSIS — I6990 Unspecified sequelae of unspecified cerebrovascular disease: Secondary | ICD-10-CM | POA: Diagnosis not present

## 2022-06-24 DIAGNOSIS — E1122 Type 2 diabetes mellitus with diabetic chronic kidney disease: Secondary | ICD-10-CM | POA: Diagnosis not present

## 2022-06-24 DIAGNOSIS — E038 Other specified hypothyroidism: Secondary | ICD-10-CM | POA: Diagnosis not present

## 2022-06-24 DIAGNOSIS — E559 Vitamin D deficiency, unspecified: Secondary | ICD-10-CM | POA: Diagnosis not present

## 2022-06-24 DIAGNOSIS — D126 Benign neoplasm of colon, unspecified: Secondary | ICD-10-CM | POA: Diagnosis not present

## 2022-06-24 DIAGNOSIS — I129 Hypertensive chronic kidney disease with stage 1 through stage 4 chronic kidney disease, or unspecified chronic kidney disease: Secondary | ICD-10-CM | POA: Diagnosis not present

## 2022-06-24 DIAGNOSIS — Z0000 Encounter for general adult medical examination without abnormal findings: Secondary | ICD-10-CM | POA: Diagnosis not present

## 2022-06-24 DIAGNOSIS — N183 Chronic kidney disease, stage 3 unspecified: Secondary | ICD-10-CM | POA: Diagnosis not present

## 2022-06-24 DIAGNOSIS — E785 Hyperlipidemia, unspecified: Secondary | ICD-10-CM | POA: Diagnosis not present

## 2023-02-05 DIAGNOSIS — E038 Other specified hypothyroidism: Secondary | ICD-10-CM | POA: Diagnosis not present

## 2023-02-05 DIAGNOSIS — I6990 Unspecified sequelae of unspecified cerebrovascular disease: Secondary | ICD-10-CM | POA: Diagnosis not present

## 2023-02-05 DIAGNOSIS — N183 Chronic kidney disease, stage 3 unspecified: Secondary | ICD-10-CM | POA: Diagnosis not present

## 2023-02-05 DIAGNOSIS — E1122 Type 2 diabetes mellitus with diabetic chronic kidney disease: Secondary | ICD-10-CM | POA: Diagnosis not present

## 2023-02-05 DIAGNOSIS — I129 Hypertensive chronic kidney disease with stage 1 through stage 4 chronic kidney disease, or unspecified chronic kidney disease: Secondary | ICD-10-CM | POA: Diagnosis not present

## 2023-02-05 DIAGNOSIS — F5105 Insomnia due to other mental disorder: Secondary | ICD-10-CM | POA: Diagnosis not present

## 2023-02-05 DIAGNOSIS — Z6832 Body mass index (BMI) 32.0-32.9, adult: Secondary | ICD-10-CM | POA: Diagnosis not present

## 2023-02-05 DIAGNOSIS — E785 Hyperlipidemia, unspecified: Secondary | ICD-10-CM | POA: Diagnosis not present

## 2023-02-05 DIAGNOSIS — M150 Primary generalized (osteo)arthritis: Secondary | ICD-10-CM | POA: Diagnosis not present

## 2023-03-20 DIAGNOSIS — R300 Dysuria: Secondary | ICD-10-CM | POA: Diagnosis not present

## 2023-03-20 DIAGNOSIS — E038 Other specified hypothyroidism: Secondary | ICD-10-CM | POA: Diagnosis not present

## 2023-03-20 DIAGNOSIS — E1122 Type 2 diabetes mellitus with diabetic chronic kidney disease: Secondary | ICD-10-CM | POA: Diagnosis not present

## 2023-03-20 DIAGNOSIS — R350 Frequency of micturition: Secondary | ICD-10-CM | POA: Diagnosis not present

## 2023-07-01 DIAGNOSIS — I129 Hypertensive chronic kidney disease with stage 1 through stage 4 chronic kidney disease, or unspecified chronic kidney disease: Secondary | ICD-10-CM | POA: Diagnosis not present

## 2023-07-01 DIAGNOSIS — E1122 Type 2 diabetes mellitus with diabetic chronic kidney disease: Secondary | ICD-10-CM | POA: Diagnosis not present

## 2023-07-01 DIAGNOSIS — M150 Primary generalized (osteo)arthritis: Secondary | ICD-10-CM | POA: Diagnosis not present

## 2023-07-01 DIAGNOSIS — E038 Other specified hypothyroidism: Secondary | ICD-10-CM | POA: Diagnosis not present

## 2023-07-14 DIAGNOSIS — G629 Polyneuropathy, unspecified: Secondary | ICD-10-CM | POA: Diagnosis not present

## 2023-07-14 DIAGNOSIS — E785 Hyperlipidemia, unspecified: Secondary | ICD-10-CM | POA: Diagnosis not present

## 2023-07-14 DIAGNOSIS — I129 Hypertensive chronic kidney disease with stage 1 through stage 4 chronic kidney disease, or unspecified chronic kidney disease: Secondary | ICD-10-CM | POA: Diagnosis not present

## 2023-07-14 DIAGNOSIS — F329 Major depressive disorder, single episode, unspecified: Secondary | ICD-10-CM | POA: Diagnosis not present

## 2023-07-14 DIAGNOSIS — N183 Chronic kidney disease, stage 3 unspecified: Secondary | ICD-10-CM | POA: Diagnosis not present

## 2023-07-14 DIAGNOSIS — E559 Vitamin D deficiency, unspecified: Secondary | ICD-10-CM | POA: Diagnosis not present

## 2023-07-14 DIAGNOSIS — E1122 Type 2 diabetes mellitus with diabetic chronic kidney disease: Secondary | ICD-10-CM | POA: Diagnosis not present

## 2023-07-14 DIAGNOSIS — N390 Urinary tract infection, site not specified: Secondary | ICD-10-CM | POA: Diagnosis not present

## 2023-07-14 DIAGNOSIS — E038 Other specified hypothyroidism: Secondary | ICD-10-CM | POA: Diagnosis not present

## 2023-07-14 DIAGNOSIS — I6990 Unspecified sequelae of unspecified cerebrovascular disease: Secondary | ICD-10-CM | POA: Diagnosis not present

## 2023-07-14 DIAGNOSIS — Z0000 Encounter for general adult medical examination without abnormal findings: Secondary | ICD-10-CM | POA: Diagnosis not present

## 2023-08-01 DIAGNOSIS — M150 Primary generalized (osteo)arthritis: Secondary | ICD-10-CM | POA: Diagnosis not present

## 2023-08-01 DIAGNOSIS — I129 Hypertensive chronic kidney disease with stage 1 through stage 4 chronic kidney disease, or unspecified chronic kidney disease: Secondary | ICD-10-CM | POA: Diagnosis not present

## 2023-08-01 DIAGNOSIS — E1122 Type 2 diabetes mellitus with diabetic chronic kidney disease: Secondary | ICD-10-CM | POA: Diagnosis not present

## 2023-08-01 DIAGNOSIS — E038 Other specified hypothyroidism: Secondary | ICD-10-CM | POA: Diagnosis not present

## 2023-09-08 DIAGNOSIS — N183 Chronic kidney disease, stage 3 unspecified: Secondary | ICD-10-CM | POA: Diagnosis not present

## 2023-09-08 DIAGNOSIS — I129 Hypertensive chronic kidney disease with stage 1 through stage 4 chronic kidney disease, or unspecified chronic kidney disease: Secondary | ICD-10-CM | POA: Diagnosis not present

## 2023-09-08 DIAGNOSIS — E1122 Type 2 diabetes mellitus with diabetic chronic kidney disease: Secondary | ICD-10-CM | POA: Diagnosis not present

## 2023-10-01 DIAGNOSIS — E038 Other specified hypothyroidism: Secondary | ICD-10-CM | POA: Diagnosis not present

## 2023-10-01 DIAGNOSIS — I129 Hypertensive chronic kidney disease with stage 1 through stage 4 chronic kidney disease, or unspecified chronic kidney disease: Secondary | ICD-10-CM | POA: Diagnosis not present

## 2023-10-01 DIAGNOSIS — E1122 Type 2 diabetes mellitus with diabetic chronic kidney disease: Secondary | ICD-10-CM | POA: Diagnosis not present

## 2023-10-01 DIAGNOSIS — E785 Hyperlipidemia, unspecified: Secondary | ICD-10-CM | POA: Diagnosis not present

## 2023-10-01 DIAGNOSIS — N183 Chronic kidney disease, stage 3 unspecified: Secondary | ICD-10-CM | POA: Diagnosis not present

## 2023-10-15 DIAGNOSIS — E1122 Type 2 diabetes mellitus with diabetic chronic kidney disease: Secondary | ICD-10-CM | POA: Diagnosis not present

## 2023-10-15 DIAGNOSIS — N183 Chronic kidney disease, stage 3 unspecified: Secondary | ICD-10-CM | POA: Diagnosis not present

## 2023-10-15 DIAGNOSIS — I129 Hypertensive chronic kidney disease with stage 1 through stage 4 chronic kidney disease, or unspecified chronic kidney disease: Secondary | ICD-10-CM | POA: Diagnosis not present

## 2023-11-01 DIAGNOSIS — I129 Hypertensive chronic kidney disease with stage 1 through stage 4 chronic kidney disease, or unspecified chronic kidney disease: Secondary | ICD-10-CM | POA: Diagnosis not present

## 2023-11-01 DIAGNOSIS — E785 Hyperlipidemia, unspecified: Secondary | ICD-10-CM | POA: Diagnosis not present

## 2023-11-01 DIAGNOSIS — E1122 Type 2 diabetes mellitus with diabetic chronic kidney disease: Secondary | ICD-10-CM | POA: Diagnosis not present

## 2023-11-01 DIAGNOSIS — N183 Chronic kidney disease, stage 3 unspecified: Secondary | ICD-10-CM | POA: Diagnosis not present

## 2023-11-01 DIAGNOSIS — E038 Other specified hypothyroidism: Secondary | ICD-10-CM | POA: Diagnosis not present

## 2023-11-14 DIAGNOSIS — E1122 Type 2 diabetes mellitus with diabetic chronic kidney disease: Secondary | ICD-10-CM | POA: Diagnosis not present

## 2023-11-14 DIAGNOSIS — N183 Chronic kidney disease, stage 3 unspecified: Secondary | ICD-10-CM | POA: Diagnosis not present

## 2023-11-14 DIAGNOSIS — I129 Hypertensive chronic kidney disease with stage 1 through stage 4 chronic kidney disease, or unspecified chronic kidney disease: Secondary | ICD-10-CM | POA: Diagnosis not present

## 2024-01-01 DIAGNOSIS — E785 Hyperlipidemia, unspecified: Secondary | ICD-10-CM | POA: Diagnosis not present

## 2024-01-01 DIAGNOSIS — E1122 Type 2 diabetes mellitus with diabetic chronic kidney disease: Secondary | ICD-10-CM | POA: Diagnosis not present

## 2024-01-01 DIAGNOSIS — E038 Other specified hypothyroidism: Secondary | ICD-10-CM | POA: Diagnosis not present

## 2024-01-14 DIAGNOSIS — E038 Other specified hypothyroidism: Secondary | ICD-10-CM | POA: Diagnosis not present

## 2024-01-14 DIAGNOSIS — I129 Hypertensive chronic kidney disease with stage 1 through stage 4 chronic kidney disease, or unspecified chronic kidney disease: Secondary | ICD-10-CM | POA: Diagnosis not present

## 2024-01-14 DIAGNOSIS — F329 Major depressive disorder, single episode, unspecified: Secondary | ICD-10-CM | POA: Diagnosis not present

## 2024-01-14 DIAGNOSIS — E1122 Type 2 diabetes mellitus with diabetic chronic kidney disease: Secondary | ICD-10-CM | POA: Diagnosis not present

## 2024-01-14 DIAGNOSIS — F5105 Insomnia due to other mental disorder: Secondary | ICD-10-CM | POA: Diagnosis not present

## 2024-01-14 DIAGNOSIS — E559 Vitamin D deficiency, unspecified: Secondary | ICD-10-CM | POA: Diagnosis not present

## 2024-01-14 DIAGNOSIS — M150 Primary generalized (osteo)arthritis: Secondary | ICD-10-CM | POA: Diagnosis not present

## 2024-01-14 DIAGNOSIS — E1165 Type 2 diabetes mellitus with hyperglycemia: Secondary | ICD-10-CM | POA: Diagnosis not present

## 2024-01-14 DIAGNOSIS — I6990 Unspecified sequelae of unspecified cerebrovascular disease: Secondary | ICD-10-CM | POA: Diagnosis not present

## 2024-01-14 DIAGNOSIS — E785 Hyperlipidemia, unspecified: Secondary | ICD-10-CM | POA: Diagnosis not present

## 2024-01-14 DIAGNOSIS — N183 Chronic kidney disease, stage 3 unspecified: Secondary | ICD-10-CM | POA: Diagnosis not present

## 2024-01-31 DIAGNOSIS — E038 Other specified hypothyroidism: Secondary | ICD-10-CM | POA: Diagnosis not present

## 2024-01-31 DIAGNOSIS — E785 Hyperlipidemia, unspecified: Secondary | ICD-10-CM | POA: Diagnosis not present

## 2024-01-31 DIAGNOSIS — E1122 Type 2 diabetes mellitus with diabetic chronic kidney disease: Secondary | ICD-10-CM | POA: Diagnosis not present

## 2024-02-04 DIAGNOSIS — M1711 Unilateral primary osteoarthritis, right knee: Secondary | ICD-10-CM | POA: Diagnosis not present
